# Patient Record
Sex: Male | Born: 1975
Health system: Southern US, Community
[De-identification: ages and names within clinical notes are randomized; demographics above are authoritative.]

## PROBLEM LIST (undated history)

## (undated) DIAGNOSIS — I509 Heart failure, unspecified: Secondary | ICD-10-CM

## (undated) DIAGNOSIS — I10 Essential (primary) hypertension: Secondary | ICD-10-CM

## (undated) DIAGNOSIS — F988 Other specified behavioral and emotional disorders with onset usually occurring in childhood and adolescence: Secondary | ICD-10-CM

---

## 2016-08-01 DIAGNOSIS — F900 Attention-deficit hyperactivity disorder, predominantly inattentive type: Secondary | ICD-10-CM | POA: Diagnosis not present

## 2017-01-23 DIAGNOSIS — F900 Attention-deficit hyperactivity disorder, predominantly inattentive type: Secondary | ICD-10-CM | POA: Diagnosis not present

## 2017-07-17 DIAGNOSIS — F900 Attention-deficit hyperactivity disorder, predominantly inattentive type: Secondary | ICD-10-CM | POA: Diagnosis not present

## 2018-01-15 DIAGNOSIS — F900 Attention-deficit hyperactivity disorder, predominantly inattentive type: Secondary | ICD-10-CM | POA: Diagnosis not present

## 2018-04-11 DIAGNOSIS — Z125 Encounter for screening for malignant neoplasm of prostate: Secondary | ICD-10-CM | POA: Diagnosis not present

## 2018-04-11 DIAGNOSIS — I10 Essential (primary) hypertension: Secondary | ICD-10-CM | POA: Diagnosis not present

## 2018-04-11 DIAGNOSIS — Z136 Encounter for screening for cardiovascular disorders: Secondary | ICD-10-CM | POA: Diagnosis not present

## 2018-04-11 DIAGNOSIS — Z0000 Encounter for general adult medical examination without abnormal findings: Secondary | ICD-10-CM | POA: Diagnosis not present

## 2018-07-09 DIAGNOSIS — F900 Attention-deficit hyperactivity disorder, predominantly inattentive type: Secondary | ICD-10-CM | POA: Diagnosis not present

## 2018-10-10 DIAGNOSIS — I10 Essential (primary) hypertension: Secondary | ICD-10-CM | POA: Diagnosis not present

## 2018-12-03 DIAGNOSIS — E876 Hypokalemia: Secondary | ICD-10-CM | POA: Diagnosis not present

## 2018-12-31 DIAGNOSIS — F900 Attention-deficit hyperactivity disorder, predominantly inattentive type: Secondary | ICD-10-CM | POA: Diagnosis not present

## 2019-01-24 DIAGNOSIS — R55 Syncope and collapse: Secondary | ICD-10-CM | POA: Diagnosis not present

## 2019-01-24 DIAGNOSIS — I4510 Unspecified right bundle-branch block: Secondary | ICD-10-CM | POA: Diagnosis not present

## 2019-01-24 DIAGNOSIS — F10939 Alcohol use, unspecified with withdrawal, unspecified: Secondary | ICD-10-CM | POA: Diagnosis not present

## 2019-01-24 DIAGNOSIS — R400 Somnolence: Secondary | ICD-10-CM | POA: Diagnosis not present

## 2019-01-24 DIAGNOSIS — R4182 Altered mental status, unspecified: Secondary | ICD-10-CM | POA: Diagnosis not present

## 2019-01-24 DIAGNOSIS — E876 Hypokalemia: Secondary | ICD-10-CM | POA: Diagnosis not present

## 2019-01-24 DIAGNOSIS — R000 Tachycardia, unspecified: Secondary | ICD-10-CM | POA: Diagnosis not present

## 2019-01-24 DIAGNOSIS — R251 Tremor, unspecified: Secondary | ICD-10-CM | POA: Diagnosis not present

## 2019-01-24 DIAGNOSIS — F17200 Nicotine dependence, unspecified, uncomplicated: Secondary | ICD-10-CM | POA: Diagnosis not present

## 2019-01-24 DIAGNOSIS — F10230 Alcohol dependence with withdrawal, uncomplicated: Secondary | ICD-10-CM | POA: Diagnosis not present

## 2019-01-24 DIAGNOSIS — Z87891 Personal history of nicotine dependence: Secondary | ICD-10-CM | POA: Diagnosis not present

## 2019-06-24 DIAGNOSIS — F900 Attention-deficit hyperactivity disorder, predominantly inattentive type: Secondary | ICD-10-CM | POA: Diagnosis not present

## 2019-06-24 DIAGNOSIS — F4322 Adjustment disorder with anxiety: Secondary | ICD-10-CM | POA: Diagnosis not present

## 2019-07-18 DIAGNOSIS — R55 Syncope and collapse: Secondary | ICD-10-CM | POA: Diagnosis not present

## 2019-10-14 DIAGNOSIS — I10 Essential (primary) hypertension: Secondary | ICD-10-CM | POA: Diagnosis not present

## 2019-12-16 DIAGNOSIS — F4322 Adjustment disorder with anxiety: Secondary | ICD-10-CM | POA: Diagnosis not present

## 2019-12-16 DIAGNOSIS — F900 Attention-deficit hyperactivity disorder, predominantly inattentive type: Secondary | ICD-10-CM | POA: Diagnosis not present

## 2020-01-26 DIAGNOSIS — R040 Epistaxis: Secondary | ICD-10-CM | POA: Diagnosis not present

## 2020-03-02 DIAGNOSIS — R040 Epistaxis: Secondary | ICD-10-CM | POA: Diagnosis not present

## 2020-03-30 DIAGNOSIS — R040 Epistaxis: Secondary | ICD-10-CM | POA: Diagnosis not present

## 2020-04-19 DIAGNOSIS — T300 Burn of unspecified body region, unspecified degree: Secondary | ICD-10-CM | POA: Diagnosis not present

## 2020-04-19 DIAGNOSIS — Z13220 Encounter for screening for lipoid disorders: Secondary | ICD-10-CM | POA: Diagnosis not present

## 2020-04-19 DIAGNOSIS — Z0000 Encounter for general adult medical examination without abnormal findings: Secondary | ICD-10-CM | POA: Diagnosis not present

## 2020-04-19 DIAGNOSIS — I10 Essential (primary) hypertension: Secondary | ICD-10-CM | POA: Diagnosis not present

## 2020-04-27 DIAGNOSIS — R945 Abnormal results of liver function studies: Secondary | ICD-10-CM | POA: Diagnosis not present

## 2020-04-27 DIAGNOSIS — R7989 Other specified abnormal findings of blood chemistry: Secondary | ICD-10-CM | POA: Diagnosis not present

## 2020-05-11 DIAGNOSIS — R040 Epistaxis: Secondary | ICD-10-CM | POA: Diagnosis not present

## 2020-05-12 DIAGNOSIS — R162 Hepatomegaly with splenomegaly, not elsewhere classified: Secondary | ICD-10-CM | POA: Diagnosis not present

## 2020-05-12 DIAGNOSIS — K760 Fatty (change of) liver, not elsewhere classified: Secondary | ICD-10-CM | POA: Diagnosis not present

## 2020-05-18 ENCOUNTER — Other Ambulatory Visit: Payer: Self-pay | Admitting: Family Medicine

## 2020-05-18 ENCOUNTER — Other Ambulatory Visit: Payer: Self-pay | Admitting: Internal Medicine

## 2020-05-18 DIAGNOSIS — R160 Hepatomegaly, not elsewhere classified: Secondary | ICD-10-CM

## 2020-05-19 DIAGNOSIS — R600 Localized edema: Secondary | ICD-10-CM | POA: Diagnosis not present

## 2020-05-19 DIAGNOSIS — D649 Anemia, unspecified: Secondary | ICD-10-CM | POA: Diagnosis not present

## 2020-05-19 DIAGNOSIS — R17 Unspecified jaundice: Secondary | ICD-10-CM | POA: Diagnosis not present

## 2020-05-19 DIAGNOSIS — R011 Cardiac murmur, unspecified: Secondary | ICD-10-CM | POA: Diagnosis not present

## 2020-05-20 ENCOUNTER — Other Ambulatory Visit: Payer: Self-pay | Admitting: Family Medicine

## 2020-05-20 DIAGNOSIS — R7989 Other specified abnormal findings of blood chemistry: Secondary | ICD-10-CM

## 2020-05-20 DIAGNOSIS — M7989 Other specified soft tissue disorders: Secondary | ICD-10-CM

## 2020-05-21 ENCOUNTER — Other Ambulatory Visit: Payer: Self-pay

## 2020-05-21 ENCOUNTER — Ambulatory Visit (INDEPENDENT_AMBULATORY_CARE_PROVIDER_SITE_OTHER): Payer: BC Managed Care – PPO

## 2020-05-21 DIAGNOSIS — M7989 Other specified soft tissue disorders: Secondary | ICD-10-CM

## 2020-05-21 DIAGNOSIS — R600 Localized edema: Secondary | ICD-10-CM | POA: Diagnosis not present

## 2020-05-21 DIAGNOSIS — R7989 Other specified abnormal findings of blood chemistry: Secondary | ICD-10-CM | POA: Diagnosis not present

## 2020-05-23 ENCOUNTER — Emergency Department (HOSPITAL_COMMUNITY): Payer: BC Managed Care – PPO

## 2020-05-23 ENCOUNTER — Observation Stay (HOSPITAL_COMMUNITY): Payer: BC Managed Care – PPO

## 2020-05-23 ENCOUNTER — Encounter (HOSPITAL_COMMUNITY): Payer: Self-pay | Admitting: Emergency Medicine

## 2020-05-23 ENCOUNTER — Inpatient Hospital Stay (HOSPITAL_COMMUNITY)
Admission: EM | Admit: 2020-05-23 | Discharge: 2020-05-29 | DRG: 871 | Disposition: A | Discharge status: Home / Self Care | Payer: BC Managed Care – PPO | Attending: Internal Medicine | Admitting: Internal Medicine

## 2020-05-23 DIAGNOSIS — E8770 Fluid overload, unspecified: Secondary | ICD-10-CM | POA: Diagnosis present

## 2020-05-23 DIAGNOSIS — J811 Chronic pulmonary edema: Secondary | ICD-10-CM | POA: Diagnosis not present

## 2020-05-23 DIAGNOSIS — J189 Pneumonia, unspecified organism: Secondary | ICD-10-CM | POA: Diagnosis not present

## 2020-05-23 DIAGNOSIS — K709 Alcoholic liver disease, unspecified: Secondary | ICD-10-CM | POA: Diagnosis present

## 2020-05-23 DIAGNOSIS — R778 Other specified abnormalities of plasma proteins: Secondary | ICD-10-CM | POA: Diagnosis present

## 2020-05-23 DIAGNOSIS — K7460 Unspecified cirrhosis of liver: Secondary | ICD-10-CM

## 2020-05-23 DIAGNOSIS — K8020 Calculus of gallbladder without cholecystitis without obstruction: Secondary | ICD-10-CM | POA: Diagnosis not present

## 2020-05-23 DIAGNOSIS — E871 Hypo-osmolality and hyponatremia: Secondary | ICD-10-CM | POA: Diagnosis not present

## 2020-05-23 DIAGNOSIS — F10230 Alcohol dependence with withdrawal, uncomplicated: Secondary | ICD-10-CM | POA: Diagnosis not present

## 2020-05-23 DIAGNOSIS — Z79899 Other long term (current) drug therapy: Secondary | ICD-10-CM

## 2020-05-23 DIAGNOSIS — Z801 Family history of malignant neoplasm of trachea, bronchus and lung: Secondary | ICD-10-CM

## 2020-05-23 DIAGNOSIS — A419 Sepsis, unspecified organism: Principal | ICD-10-CM | POA: Diagnosis present

## 2020-05-23 DIAGNOSIS — D509 Iron deficiency anemia, unspecified: Secondary | ICD-10-CM | POA: Diagnosis not present

## 2020-05-23 DIAGNOSIS — D500 Iron deficiency anemia secondary to blood loss (chronic): Secondary | ICD-10-CM | POA: Diagnosis present

## 2020-05-23 DIAGNOSIS — R7989 Other specified abnormal findings of blood chemistry: Secondary | ICD-10-CM | POA: Diagnosis not present

## 2020-05-23 DIAGNOSIS — I509 Heart failure, unspecified: Secondary | ICD-10-CM

## 2020-05-23 DIAGNOSIS — D122 Benign neoplasm of ascending colon: Secondary | ICD-10-CM | POA: Diagnosis not present

## 2020-05-23 DIAGNOSIS — K922 Gastrointestinal hemorrhage, unspecified: Secondary | ICD-10-CM | POA: Diagnosis not present

## 2020-05-23 DIAGNOSIS — I517 Cardiomegaly: Secondary | ICD-10-CM | POA: Diagnosis not present

## 2020-05-23 DIAGNOSIS — R040 Epistaxis: Secondary | ICD-10-CM | POA: Diagnosis present

## 2020-05-23 DIAGNOSIS — K635 Polyp of colon: Secondary | ICD-10-CM | POA: Diagnosis present

## 2020-05-23 DIAGNOSIS — R0609 Other forms of dyspnea: Secondary | ICD-10-CM | POA: Diagnosis present

## 2020-05-23 DIAGNOSIS — R188 Other ascites: Secondary | ICD-10-CM | POA: Diagnosis not present

## 2020-05-23 DIAGNOSIS — K760 Fatty (change of) liver, not elsewhere classified: Secondary | ICD-10-CM | POA: Diagnosis not present

## 2020-05-23 DIAGNOSIS — K921 Melena: Secondary | ICD-10-CM | POA: Diagnosis not present

## 2020-05-23 DIAGNOSIS — E872 Acidosis, unspecified: Secondary | ICD-10-CM | POA: Diagnosis present

## 2020-05-23 DIAGNOSIS — E876 Hypokalemia: Secondary | ICD-10-CM | POA: Diagnosis not present

## 2020-05-23 DIAGNOSIS — K625 Hemorrhage of anus and rectum: Secondary | ICD-10-CM

## 2020-05-23 DIAGNOSIS — R000 Tachycardia, unspecified: Secondary | ICD-10-CM | POA: Diagnosis present

## 2020-05-23 DIAGNOSIS — K5730 Diverticulosis of large intestine without perforation or abscess without bleeding: Secondary | ICD-10-CM | POA: Diagnosis present

## 2020-05-23 DIAGNOSIS — K8080 Other cholelithiasis without obstruction: Secondary | ICD-10-CM | POA: Diagnosis not present

## 2020-05-23 DIAGNOSIS — D649 Anemia, unspecified: Secondary | ICD-10-CM

## 2020-05-23 DIAGNOSIS — R160 Hepatomegaly, not elsewhere classified: Secondary | ICD-10-CM | POA: Diagnosis not present

## 2020-05-23 DIAGNOSIS — R0602 Shortness of breath: Secondary | ICD-10-CM | POA: Diagnosis not present

## 2020-05-23 DIAGNOSIS — J90 Pleural effusion, not elsewhere classified: Secondary | ICD-10-CM | POA: Diagnosis not present

## 2020-05-23 DIAGNOSIS — J9811 Atelectasis: Secondary | ICD-10-CM | POA: Diagnosis not present

## 2020-05-23 DIAGNOSIS — Z20822 Contact with and (suspected) exposure to covid-19: Secondary | ICD-10-CM | POA: Diagnosis present

## 2020-05-23 DIAGNOSIS — F1010 Alcohol abuse, uncomplicated: Secondary | ICD-10-CM | POA: Diagnosis not present

## 2020-05-23 DIAGNOSIS — I10 Essential (primary) hypertension: Secondary | ICD-10-CM | POA: Diagnosis not present

## 2020-05-23 DIAGNOSIS — I110 Hypertensive heart disease with heart failure: Secondary | ICD-10-CM | POA: Diagnosis not present

## 2020-05-23 HISTORY — DX: Heart failure, unspecified: I50.9

## 2020-05-23 HISTORY — DX: Other specified behavioral and emotional disorders with onset usually occurring in childhood and adolescence: F98.8

## 2020-05-23 HISTORY — DX: Essential (primary) hypertension: I10

## 2020-05-23 LAB — BASIC METABOLIC PANEL
Anion gap: 11 (ref 5–15)
Anion gap: 11 (ref 5–15)
BUN: 5 mg/dL — ABNORMAL LOW (ref 6–20)
BUN: <5 mg/dL — ABNORMAL LOW (ref 6–20)
CO2: 24 mmol/L (ref 22–32)
CO2: 24 mmol/L (ref 22–32)
Calcium: 8.2 mg/dL — ABNORMAL LOW (ref 8.9–10.3)
Calcium: 7.7 mg/dL — ABNORMAL LOW (ref 8.9–10.3)
Chloride: 84 mmol/L — ABNORMAL LOW (ref 98–111)
Chloride: 81 mmol/L — ABNORMAL LOW (ref 98–111)
Creatinine, Ser: 0.7 mg/dL (ref 0.61–1.24)
Creatinine, Ser: 0.66 mg/dL (ref 0.61–1.24)
GFR, Estimated: >60 mL/min (ref >60)
GFR, Estimated: >60 mL/min (ref >60)
Glucose, Bld: 114 mg/dL — ABNORMAL HIGH (ref 70–99)
Glucose, Bld: 114 mg/dL — ABNORMAL HIGH (ref 70–99)
Potassium: 3.7 mmol/L (ref 3.5–5.1)
Potassium: 3.3 mmol/L — ABNORMAL LOW (ref 3.5–5.1)
Sodium: 119 mmol/L — CL (ref 135–145)
Sodium: 116 mmol/L — CL (ref 135–145)

## 2020-05-23 LAB — IRON AND TIBC
Iron: 23 ug/dL — ABNORMAL LOW (ref 45–182)
Saturation Ratios: 10 % — ABNORMAL LOW (ref 17.9–39.5)
TIBC: 237 ug/dL — ABNORMAL LOW (ref 250–450)
UIBC: 214 ug/dL

## 2020-05-23 LAB — LACTIC ACID, PLASMA: Lactic Acid, Venous: 2.5 mmol/L (ref 0.5–1.9)

## 2020-05-23 LAB — HEPATIC FUNCTION PANEL
ALT: 17 U/L (ref 0–44)
AST: 132 U/L — ABNORMAL HIGH (ref 15–41)
Albumin: 2.2 g/dL — ABNORMAL LOW (ref 3.5–5.0)
Alkaline Phosphatase: 156 U/L — ABNORMAL HIGH (ref 38–126)
Bilirubin, Direct: 3.5 mg/dL — ABNORMAL HIGH (ref 0.0–0.2)
Indirect Bilirubin: 3.2 mg/dL — ABNORMAL HIGH (ref 0.3–0.9)
Total Bilirubin: 6.7 mg/dL — ABNORMAL HIGH (ref 0.3–1.2)
Total Protein: 7.2 g/dL (ref 6.5–8.1)

## 2020-05-23 LAB — CBC
HCT: 19.2 % — ABNORMAL LOW (ref 39.0–52.0)
Hemoglobin: 5.9 g/dL — CL (ref 13.0–17.0)
MCH: 26.9 pg (ref 26.0–34.0)
MCHC: 30.7 g/dL (ref 30.0–36.0)
MCV: 87.7 fL (ref 80.0–100.0)
Platelets: 253 10*3/uL (ref 150–400)
RBC: 2.19 MIL/uL — ABNORMAL LOW (ref 4.22–5.81)
RDW: 20.7 % — ABNORMAL HIGH (ref 11.5–15.5)
WBC: 19.6 10*3/uL — ABNORMAL HIGH (ref 4.0–10.5)
nRBC: 0.2 % (ref 0.0–0.2)

## 2020-05-23 LAB — TROPONIN I (HIGH SENSITIVITY): Troponin I (High Sensitivity): 19 ng/L — ABNORMAL HIGH (ref <18)

## 2020-05-23 LAB — FOLATE: Folate: 2.2 ng/mL — ABNORMAL LOW (ref >5.9)

## 2020-05-23 LAB — PROTIME-INR
INR: 1.5 — ABNORMAL HIGH (ref 0.8–1.2)
Prothrombin Time: 17.4 seconds — ABNORMAL HIGH (ref 11.4–15.2)

## 2020-05-23 LAB — MAGNESIUM: Magnesium: 1.9 mg/dL (ref 1.7–2.4)

## 2020-05-23 LAB — APTT: aPTT: 42 seconds — ABNORMAL HIGH (ref 24–36)

## 2020-05-23 LAB — PREPARE RBC (CROSSMATCH)

## 2020-05-23 LAB — RESP PANEL BY RT-PCR (FLU A&B, COVID) ARPGX2
Influenza A by PCR: NEGATIVE
Influenza B by PCR: NEGATIVE
SARS Coronavirus 2 by RT PCR: NEGATIVE

## 2020-05-23 LAB — D-DIMER, QUANTITATIVE: D-Dimer, Quant: 5.47 ug/mL-FEU — ABNORMAL HIGH (ref 0.00–0.50)

## 2020-05-23 LAB — TSH: TSH: 3.911 u[IU]/mL (ref 0.350–4.500)

## 2020-05-23 LAB — C-REACTIVE PROTEIN: CRP: 13.1 mg/dL — ABNORMAL HIGH (ref <1.0)

## 2020-05-23 LAB — VITAMIN B12: Vitamin B-12: 1427 pg/mL — ABNORMAL HIGH (ref 180–914)

## 2020-05-23 LAB — AMMONIA: Ammonia: 51 umol/L — ABNORMAL HIGH (ref 9–35)

## 2020-05-23 LAB — PROCALCITONIN: Procalcitonin: 0.58 ng/mL

## 2020-05-23 LAB — POC OCCULT BLOOD, ED: Fecal Occult Bld: POSITIVE — AB

## 2020-05-23 LAB — BRAIN NATRIURETIC PEPTIDE: B Natriuretic Peptide: 329.2 pg/mL — ABNORMAL HIGH (ref 0.0–100.0)

## 2020-05-23 LAB — ABO/RH: ABO/RH(D): A POS

## 2020-05-23 MED ORDER — FUROSEMIDE 10 MG/ML IJ SOLN
40.0000 mg | Freq: Once | INTRAMUSCULAR | Status: AC
  Administered 2020-05-23: 40 mg via INTRAVENOUS
  Filled 2020-05-23: qty 4

## 2020-05-23 MED ORDER — THIAMINE HCL 100 MG/ML IJ SOLN
100.0000 mg | Freq: Every day | INTRAMUSCULAR | Status: DC
  Administered 2020-05-23 – 2020-05-26 (×2): 100 mg via INTRAVENOUS
  Filled 2020-05-23 (×2): qty 2

## 2020-05-23 MED ORDER — CEFTRIAXONE SODIUM 2 G IJ SOLR
2.0000 g | INTRAMUSCULAR | Status: AC
  Administered 2020-05-24 – 2020-05-28 (×5): 2 g via INTRAVENOUS
  Filled 2020-05-23 (×2): qty 2
  Filled 2020-05-23: qty 20
  Filled 2020-05-23 (×2): qty 2

## 2020-05-23 MED ORDER — ADULT MULTIVITAMIN W/MINERALS CH
1.0000 | ORAL_TABLET | Freq: Every day | ORAL | Status: DC
  Administered 2020-05-23 – 2020-05-28 (×6): 1 via ORAL
  Filled 2020-05-23 (×6): qty 1

## 2020-05-23 MED ORDER — SODIUM CHLORIDE 0.9 % IV SOLN
1.0000 g | Freq: Once | INTRAVENOUS | Status: AC
  Administered 2020-05-23: 1 g via INTRAVENOUS
  Filled 2020-05-23: qty 10

## 2020-05-23 MED ORDER — THIAMINE HCL 100 MG PO TABS
100.0000 mg | ORAL_TABLET | Freq: Every day | ORAL | Status: DC
  Administered 2020-05-23 – 2020-05-28 (×5): 100 mg via ORAL
  Filled 2020-05-23 (×5): qty 1

## 2020-05-23 MED ORDER — LORAZEPAM 1 MG PO TABS
1.0000 mg | ORAL_TABLET | ORAL | Status: AC | PRN
  Administered 2020-05-23 – 2020-05-26 (×2): 1 mg via ORAL
  Filled 2020-05-23 (×2): qty 1

## 2020-05-23 MED ORDER — LORAZEPAM 2 MG/ML IJ SOLN
1.0000 mg | INTRAMUSCULAR | Status: AC | PRN
  Administered 2020-05-24: 1 mg via INTRAVENOUS
  Administered 2020-05-24: 2 mg via INTRAVENOUS
  Administered 2020-05-25: 1 mg via INTRAVENOUS
  Filled 2020-05-23 (×3): qty 1

## 2020-05-23 MED ORDER — AZITHROMYCIN 500 MG IV SOLR
500.0000 mg | INTRAVENOUS | Status: AC
  Administered 2020-05-24 – 2020-05-28 (×5): 500 mg via INTRAVENOUS
  Filled 2020-05-23 (×6): qty 500

## 2020-05-23 MED ORDER — PANTOPRAZOLE SODIUM 40 MG IV SOLR
40.0000 mg | Freq: Two times a day (BID) | INTRAVENOUS | Status: DC
  Administered 2020-05-23: 40 mg via INTRAVENOUS
  Filled 2020-05-23: qty 40

## 2020-05-23 MED ORDER — DEXTROAMPHETAMINE SULFATE 5 MG PO TABS
20.0000 mg | ORAL_TABLET | Freq: Two times a day (BID) | ORAL | Status: DC
  Administered 2020-05-24 – 2020-05-28 (×10): 20 mg via ORAL
  Filled 2020-05-23 (×11): qty 4

## 2020-05-23 MED ORDER — LORAZEPAM 1 MG PO TABS
0.0000 mg | ORAL_TABLET | Freq: Four times a day (QID) | ORAL | Status: AC
  Administered 2020-05-24 (×2): 1 mg via ORAL
  Filled 2020-05-23 (×3): qty 1

## 2020-05-23 MED ORDER — ACETAMINOPHEN 325 MG PO TABS
325.0000 mg | ORAL_TABLET | Freq: Four times a day (QID) | ORAL | Status: DC | PRN
Start: 2020-05-23 — End: 2020-05-29

## 2020-05-23 MED ORDER — IOHEXOL 350 MG/ML SOLN
75.0000 mL | Freq: Once | INTRAVENOUS | Status: AC | PRN
  Administered 2020-05-23: 75 mL via INTRAVENOUS

## 2020-05-23 MED ORDER — SODIUM CHLORIDE 0.9 % IV SOLN
INTRAVENOUS | Status: DC
Start: 2020-05-24 — End: 2020-05-23

## 2020-05-23 MED ORDER — ACETAMINOPHEN 650 MG RE SUPP: 325.0000 mg | Freq: Four times a day (QID) | RECTAL | Status: DC | PRN

## 2020-05-23 MED ORDER — AMLODIPINE BESYLATE 10 MG PO TABS
10.0000 mg | ORAL_TABLET | Freq: Every day | ORAL | Status: DC
  Administered 2020-05-24 – 2020-05-25 (×2): 10 mg via ORAL
  Filled 2020-05-23 (×2): qty 1

## 2020-05-23 MED ORDER — SODIUM CHLORIDE 0.9 % IV SOLN
80.0000 mg | Freq: Once | INTRAVENOUS | Status: AC
  Administered 2020-05-24: 80 mg via INTRAVENOUS
  Filled 2020-05-23: qty 80

## 2020-05-23 MED ORDER — FOLIC ACID 1 MG PO TABS
1.0000 mg | ORAL_TABLET | Freq: Every day | ORAL | Status: DC
  Administered 2020-05-23 – 2020-05-28 (×6): 1 mg via ORAL
  Filled 2020-05-23 (×6): qty 1

## 2020-05-23 MED ORDER — ONDANSETRON HCL 4 MG/2ML IJ SOLN: 4.0000 mg | Freq: Four times a day (QID) | INTRAMUSCULAR | Status: DC | PRN

## 2020-05-23 MED ORDER — LORAZEPAM 1 MG PO TABS: 0.0000 mg | ORAL_TABLET | Freq: Two times a day (BID) | ORAL | Status: AC

## 2020-05-23 MED ORDER — POTASSIUM CHLORIDE CRYS ER 20 MEQ PO TBCR
40.0000 meq | EXTENDED_RELEASE_TABLET | Freq: Once | ORAL | Status: AC
  Administered 2020-05-24: 40 meq via ORAL
  Filled 2020-05-23: qty 2

## 2020-05-23 MED ORDER — FUROSEMIDE 10 MG/ML IJ SOLN
40.0000 mg | Freq: Two times a day (BID) | INTRAMUSCULAR | Status: DC
  Administered 2020-05-24 – 2020-05-25 (×3): 40 mg via INTRAVENOUS
  Filled 2020-05-23 (×3): qty 4

## 2020-05-23 MED ORDER — ONDANSETRON HCL 4 MG PO TABS: 4.0000 mg | ORAL_TABLET | Freq: Four times a day (QID) | ORAL | Status: DC | PRN

## 2020-05-23 MED ORDER — SODIUM CHLORIDE 0.9 % IV SOLN
500.0000 mg | Freq: Once | INTRAVENOUS | Status: AC
  Administered 2020-05-23: 500 mg via INTRAVENOUS
  Filled 2020-05-23 (×2): qty 500

## 2020-05-23 MED ORDER — SODIUM CHLORIDE 0.9 % IV SOLN
10.0000 mL/h | Freq: Once | INTRAVENOUS | Status: AC
  Administered 2020-05-24: 10 mL/h via INTRAVENOUS

## 2020-05-23 MED ORDER — PANTOPRAZOLE SODIUM 40 MG IV SOLR
40.0000 mg | Freq: Two times a day (BID) | INTRAVENOUS | Status: DC
  Administered 2020-05-24 – 2020-05-28 (×10): 40 mg via INTRAVENOUS
  Filled 2020-05-23 (×10): qty 40

## 2020-05-23 NOTE — ED Notes (Addendum)
Pt stated he is starting to feel anxious. RN did a CIWA. Pt got a score of 6. Pt states he drinks everyday. His last drink was yesterday 05/22/20. Pt stated he drank Bourbon (alcoholic beverage) and that he drinks too much. Pt states he can not say how much he drinks per day and that he knows its too much.

## 2020-05-23 NOTE — ED Provider Notes (Addendum)
[facility] EMERGENCY DEPARTMENT Provider Note   CSN: [id number] Arrival date & time: 05/23/20  1447     History Chief Complaint  Patient presents with  . Shortness of Breath    Edward Valdez is a 45 y.o. male. Patient in room and available to be seen at 3:14 PM.  I saw him at this time. HPI He presents for evaluation of shortness of breath, dyspnea on exertion and leg swelling, right greater than left.  Onset of symptoms 7 days ago.  He denies chest pain, back pain, abdominal pain, nausea or vomiting.  He complains of pain in the right lower leg.  He is currently taking a diuretic. He denies fever, chills, cough. nausea or vomiting.  He saw his PCP, several days ago and was started on spironolactone for diuresis with suspected component of "liver."  Patient apparently has concerning history for alcohol abuse.  His PCP informed him that he may have congestive heart failure, because his BNP was elevated.  He has not had an echocardiogram..  He had bilateral lower leg Dopplers done on 05/21/2020 which were negative for clot.  His current medicines include: Atenolol-chlorthalidone 100-25 mg 1/2 tablet each day, amlodipine 10 mg once daily, potassium 1 daily, magnesium 500 mg 1 daily, spironolactone 100 mg 1/day.  CBC done on 04/29/2020, hemoglobin 8.3, MCV 81.6.  Serum iron done on 05/20/2020 was 30, low.  PT/INR done on 04/29/2020 was normal.  Complete metabolic panel done on 04/19/2020 remarkable for elevated total bilirubin, increased alkaline phosphatase, increased AST.  No other known modifying factors.    Past Medical History:  Diagnosis Date  . ADD (attention deficit disorder)   . CHF (congestive heart failure) (HCC)   . Hypertension     Patient Active Problem List   Diagnosis Date Noted  . Essential hypertension 05/23/2020  . Hyperbilirubinemia 05/23/2020    History reviewed. No pertinent surgical history.     No family history on file.  Social History    Tobacco Use  . Smoking status: Never Smoker  . Smokeless tobacco: Never Used  Substance Use Topics  . Alcohol use: Not Currently  . Drug use: Yes    Types: Marijuana    Home Medications Prior to Admission medications   Not on File    Allergies    Patient has no allergy information on record.  Review of Systems   Review of Systems  All other systems reviewed and are negative.   Physical Exam Updated Vital Signs BP (!) 116/58   Pulse (!) 118   Temp 99.2 F (37.3 C)   Resp (!) 21   SpO2 92%   Physical Exam Vitals and nursing note reviewed.  Constitutional:      General: He is not in acute distress.    Appearance: He is well-developed. He is not ill-appearing, toxic-appearing or diaphoretic.  HENT:     Head: Normocephalic and atraumatic.     Right Ear: External ear normal.     Left Ear: External ear normal.  Eyes:     Conjunctiva/sclera: Conjunctivae normal.     Pupils: Pupils are equal, round, and reactive to light.  Neck:     Trachea: Phonation normal.  Cardiovascular:     Rate and Rhythm: Normal rate and regular rhythm.     Heart sounds: Normal heart sounds.  Pulmonary:     Effort: Pulmonary effort is normal. No respiratory distress.     Breath sounds: Normal breath sounds. No stridor. No wheezing,  rhonchi or rales.  Abdominal:     General: There is distension.     Palpations: Abdomen is soft.     Tenderness: There is no abdominal tenderness.  Genitourinary:    Comments: Normal anus.  Small amount of dark stool in rectal vault.  Stool sent for Hemoccult testing Musculoskeletal:        General: Normal range of motion.     Cervical back: Normal range of motion and neck supple.     Right lower leg: Edema present.     Left lower leg: Edema present.     Comments: Right lower leg larger than left lower leg, both moderately swollen.  Skin:    General: Skin is warm and dry.  Neurological:     Mental Status: He is alert and oriented to person, place, and  time.     Cranial Nerves: No cranial nerve deficit.     Sensory: No sensory deficit.     Motor: No abnormal muscle tone.     Coordination: Coordination normal.  Psychiatric:        Mood and Affect: Mood normal.        Behavior: Behavior normal.        Thought Content: Thought content normal.        Judgment: Judgment normal.     ED Results / Procedures / Treatments   Labs (all labs ordered are listed, but only abnormal results are displayed) Labs Reviewed  BASIC METABOLIC PANEL - Abnormal; Notable for the following components:      Result Value   Sodium 119 (*)    Chloride 84 (*)    Glucose, Bld 114 (*)    BUN 5 (*)    Calcium 8.2 (*)    All other components within normal limits  CBC - Abnormal; Notable for the following components:   WBC 19.6 (*)    RBC 2.19 (*)    Hemoglobin 5.9 (*)    HCT 19.2 (*)    RDW 20.7 (*)    All other components within normal limits  BRAIN NATRIURETIC PEPTIDE - Abnormal; Notable for the following components:   B Natriuretic Peptide 329.2 (*)    All other components within normal limits  D-DIMER, QUANTITATIVE - Abnormal; Notable for the following components:   D-Dimer, Quant 5.47 (*)    All other components within normal limits  POC OCCULT BLOOD, ED - Abnormal; Notable for the following components:   Fecal Occult Bld POSITIVE (*)    All other components within normal limits  RESP PANEL BY RT-PCR (FLU A&B, COVID) ARPGX2  TYPE AND SCREEN  PREPARE RBC (CROSSMATCH)  ABO/RH    EKG EKG Interpretation  Date/Time:  Sunday May 23 2020 14:55:48 EDT Ventricular Rate:  103 PR Interval:  174 QRS Duration: 134 QT Interval:  374 QTC Calculation: 489 R Axis:   88 Text Interpretation: Sinus tachycardia Right bundle branch block Septal infarct , age undetermined Abnormal ECG No old tracing to compare Confirmed by Mancel Bale [id number]) on 05/23/2020 3:03:56 PM   Radiology DG Chest 2 View  Result Date: 05/23/2020 CLINICAL DATA:  Shortness of  breath EXAM: CHEST - 2 VIEW COMPARISON:  None. FINDINGS: Cardiomegaly. There is a rounded, masslike opacity of the central right upper lobe which measures approximately 4.2 cm in projection. There is superimposed heterogeneous and interstitial airspace opacity, particularly of the perihilar left lung. Small left, trace right pleural effusions. The visualized skeletal structures are unremarkable. IMPRESSION: 1. There is a rounded, masslike  opacity of the central right upper lobe which measures approximately 4.2 cm in projection. This is concerning for malignancy, although may reflect infectious airspace disease. Consider CT to further evaluate. Absolute minimum recommend radiographic follow-up at 6-8 weeks to ensure complete resolution. 2. There is superimposed heterogeneous and interstitial airspace opacity, particularly of the perihilar left lung. Small left, trace right pleural effusions. Findings suggest edema or infection. 3. Cardiomegaly. Electronically Signed   By: Lauralyn Primes M.D.   On: 05/23/2020 15:19   CT Angio Chest PE W/Cm &/Or Wo Cm  Result Date: 05/23/2020 CLINICAL DATA:  PE suspected, low/intermediate prob, positive D-dimer Shortness of breath, possible PE, lung mass EXAM: CT ANGIOGRAPHY CHEST WITH CONTRAST TECHNIQUE: Multidetector CT imaging of the chest was performed using the standard protocol during bolus administration of intravenous contrast. Multiplanar CT image reconstructions and MIPs were obtained to evaluate the vascular anatomy. CONTRAST:  42mL OMNIPAQUE IOHEXOL 350 MG/ML SOLN COMPARISON:  Radiograph earlier today. FINDINGS: Cardiovascular: Evaluation for pulmonary embolus is diagnostic to the lobar levels in the mid and upper lung zones and segmental levels at the bases. No evidence of pulmonary embolus. Normal caliber thoracic aorta without acute aortic abnormality. Upper normal heart size. No pericardial effusion. Suggestion of scattered coronary artery calcifications, not well  assessed due to motion on the current exam. Mediastinum/Nodes: Small mediastinal lymph nodes not enlarged by size criteria. No enlarged hilar lymph nodes. No axillary adenopathy. Patulous esophagus. No esophageal wall thickening. No suspicious thyroid nodule. Lungs/Pleura: There is no pulmonary mass. The masslike opacity on radiograph corresponds to a rounded area of ground-glass and consolidative opacity in the right upper lobe. Similar ground-glass and consolidative opacities within the apical and posterior segments of the right upper lobe, anterior left upper lobe, lingula, and to a lesser extent right middle and lower lobe. Some of these ground-glass opacities appear slightly nodular. Small to moderate left pleural effusion with adjacent compressive atelectasis. Trace right pleural effusion with adjacent atelectasis. Trace fluid in the fissures. There is minimal basilar septal thickening. Upper Abdomen: Diffuse hepatic steatosis. Included liver appears enlarged. No acute upper abdominal findings. Musculoskeletal: There are no acute or suspicious osseous abnormalities. Review of the MIP images confirms the above findings. IMPRESSION: 1. No central pulmonary embolus. 2. Multifocal ground-glass and consolidative opacities throughout both lungs. Differential considerations include pulmonary edema or atypical pneumonia. 3. No pulmonary mass. Radiographic appearance in the right lung correlates to a rounded area of ground-glass and consolidative opacity in the right upper lobe, as described above. 4. Small to moderate left and trace right pleural effusions with adjacent compressive atelectasis. 5. Suspect coronary artery calcifications. 6. Incidental note of hepatic steatosis and probable hepatomegaly in the upper abdomen. Electronically Signed   By: Narda Rutherford M.D.   On: 05/23/2020 18:43    Procedures .Critical Care Performed by: Mancel Bale, MD Authorized by: Mancel Bale, MD   Critical care  provider statement:    Critical care time (minutes):  75   Critical care start time:  05/23/2020 3:05 PM   Critical care end time:  05/23/2020 8:04 PM   Critical care time was exclusive of:  Separately billable procedures and treating other patients   Critical care was necessary to treat or prevent imminent or life-threatening deterioration of the following conditions:  Circulatory failure   Critical care was time spent personally by me on the following activities:  Blood draw for specimens, development of treatment plan with patient or surrogate, discussions with consultants, evaluation of patient's response  to treatment, examination of patient, obtaining history from patient or surrogate, ordering and performing treatments and interventions, ordering and review of laboratory studies, pulse oximetry, re-evaluation of patient's condition, review of old charts and ordering and review of radiographic studies     Medications Ordered in ED Medications  0.9 %  sodium chloride infusion (has no administration in time range)  furosemide (LASIX) injection 40 mg (has no administration in time range)  cefTRIAXone (ROCEPHIN) 1 g in sodium chloride 0.9 % 100 mL IVPB (has no administration in time range)  azithromycin (ZITHROMAX) 500 mg in sodium chloride 0.9 % 250 mL IVPB (has no administration in time range)  iohexol (OMNIPAQUE) 350 MG/ML injection 75 mL (75 mLs Intravenous Contrast Given 05/23/20 1825)    ED Course  I have reviewed the triage vital signs and the nursing notes.  Pertinent labs & imaging results that were available during my care of the patient were reviewed by me and considered in my medical decision making (see chart for details).  Clinical Course as of 05/23/20 2007  Sun May 23, 2020  1507 Abnormal EKG.  Patient not in room yet, and not currently accessible.  EKG is abnormal and there is no comparison available. [EW]  1657 Patient states he is aware of having anemia but is not taking  iron, until his leg swelling improves. [EW]    Clinical Course User Index [EW] Mancel BaleWentz, Timur Nibert, MD   MDM Rules/Calculators/A&P                           Patient Vitals for the past 24 hrs:  BP Temp Pulse Resp SpO2  05/23/20 2000 (!) 116/58 -- (!) 118 (!) 21 92 %  05/23/20 1945 121/67 -- (!) 101 18 93 %  05/23/20 1930 122/66 -- 100 15 95 %  05/23/20 1915 117/62 -- 100 18 94 %  05/23/20 1900 122/60 -- (!) 102 17 92 %  05/23/20 1845 128/73 -- (!) 103 (!) 21 94 %  05/23/20 1745 127/76 -- (!) 103 20 95 %  05/23/20 1730 124/84 -- (!) 101 (!) 25 97 %  05/23/20 1715 123/71 -- 98 (!) 21 94 %  05/23/20 1700 124/74 -- 98 (!) 25 98 %  05/23/20 1645 116/63 -- 97 (!) 25 96 %  05/23/20 1630 115/65 -- 98 19 97 %  05/23/20 1615 120/73 -- 100 (!) 22 97 %  05/23/20 1600 120/72 -- 95 19 99 %  05/23/20 1545 120/74 -- 99 (!) 22 100 %  05/23/20 1530 122/73 -- 99 (!) 21 98 %  05/23/20 1458 121/69 99.2 F (37.3 C) (!) 102 18 99 %    7:24 PM Reevaluation with update and discussion. After initial assessment and treatment, an updated evaluation reveals your is comfortable with oxygen saturation 95% on room air.  Vital signs are reassuring with mild tachypnea present.  Patient states he has not seen a rectal bleeding.  Findings discussed with patient and wife, all questions answered. Mancel BaleElliott Ganesh Deeg   Medical Decision Making:  This patient is presenting for evaluation of shortness of breath and dyspnea on exertion, which does require a range of treatment options, and is a complaint that involves a high risk of morbidity and mortality. The differential diagnoses include ACS, PE, pneumonia, heart failure. I decided to review old records, and in summary middle-aged male presenting with ongoing symptoms, worsening despite evaluation treatment by his PCP..  I did not require additional historical information  from anyone.  Clinical Laboratory Tests Ordered, included CBC, Metabolic panel and D-dimer, BNP. Review  indicates D-dimer high, BNP high, white count high, hemoglobin low, sodium low, chloride low, glucose, BUN low, calcium low. Radiologic Tests Ordered, included chest x-ray, follow-up CT imaging.  I independently Visualized: Radiograph images, which show consolidation right lung, with generalized groundglass opacity somewhat irregular pattern with differential diagnosis including pulmonary edema, and atypical pneumonia.  Cardiac Monitor Tracing which shows normal sinus rhythm    Critical Interventions-clinical evaluation, after testing, radiologic imaging, Covid testing, chest x-ray and advanced imaging with CT, observation , reassessment.  IV diuretic given.  Blood transfusion ordered for symptomatic anemia  After These Interventions, the Patient was reevaluated and was found with signs and symptoms of pulmonary edema.  Patient presenting with illness for about a week characterized primarily by shortness of breath with exertion.  Patient with significant anemia likely contributing to shortness of breath.  Hemoglobin dropped at least 2 g over 3 weeks, without known blood loss.  Stool guaiac positive, patient has not seen rectal bleeding and is not vomiting blood he does have a history of recurrent nosebleeding in the past year.  He has seen ENT for that and had coagulation with silver nitrate but no advanced procedures for nosebleeding.  This may also be dilutional, related to fluid overload, with chronic iron deficiency.  Patient apparently has alcohol abuse potential and possibly drinks excessive amounts of alcohol.  No confirmed history of congestive heart failure.  Doubt pneumonia without cough or fever.  Patient will be covered with Rocephin and Zithromax for possibility of community-acquired pneumonia.  He will require hospitalization for stabilization further work-up and treatment.  Addendum, 06/17/20-clarifying medical decision making.  Patient does not have convincing signs or symptoms of  sepsis/severe sepsis.  Laboratory evaluation and imaging, does not provide evidence for unifying diagnosis of pneumonia, as a reason for his admission.  Nor does he have findings of infection leading to his other acute problems. The patient was not treated for suspected sepsis because of these findings.  The abnormal vital signs, are suspected to be secondary to multiple comorbidities including fluid overload/congestive heart failure, anemia, malnutrition.  Empiric antibiotics were begun for possibility of respiratory infection including pneumonia, etiology unspecified.  CRITICAL CARE-yes Performed by: Mancel Bale  Nursing Notes Reviewed/ Care Coordinated Applicable Imaging Reviewed Interpretation of Laboratory Data incorporated into ED treatment  8:05 PM-Consult complete with hospitalist. Patient case explained and discussed.  He agrees to admit patient for further evaluation and treatment. Call ended at 8:25 PM  Plan: Admit   Final Clinical Impression(s) / ED Diagnoses Final diagnoses:  Acute on chronic congestive heart failure, unspecified heart failure type (HCC)  Anemia, unspecified type  Hyponatremia  Rectal bleeding    Rx / DC Orders ED Discharge Orders    None       Mancel Bale, MD 05/23/20 2025    Mancel Bale, MD 06/17/20 1254

## 2020-05-23 NOTE — ED Triage Notes (Signed)
Pt reports SOB x 1 week.  Denies any other symptoms.  States his PCP mentioned CHF diagnosis to him and he is taking a diuretic.

## 2020-05-23 NOTE — H&P (Signed)
History and Physical    Edward Valdez NWG:[id number] DOB: 07/24/1975 DOA: 05/23/2020  PCP: [facility]  Patient coming from: Home   Chief Complaint:  Chief Complaint  Patient presents with  . Shortness of Breath     HPI:    45 year old male with past medical history of alcohol abuse, hypertension who presents to [facility] emergency department with dyspnea on exertion.  Patient explains that for the past 2 weeks he has been experiencing shortness of breath.  The shortness of breath was initially mild in intensity but as the days progressed shortness of breath became more and more severe.  Shortness of breath is worse with exertion and improved with rest.  Over the span of time patient is complaining of associated bilateral lower extremity edema, increasing abdominal girth, generalized malaise and weakness as well as episodes of paroxysmal nocturnal dyspnea and progressive yellowing of the skin.  Patient additionally is complaining of episodic chest tightness.  Mild to moderate in intensity, located in the midsternal region without any alleviating or exacerbating factors.  Upon further questioning patient denies fever, sick contacts, recent travel or contact with confirmed COVID-19 infection.  Because of patient's progressively worsening symptoms patient's primary care provider sent the patient to [facility] emergency department for evaluation.  Upon evaluation in the emergency department patient has found to have multiple ongoing medical issues including bilateral infiltrates on chest imaging concerning for a combination of pneumonia and pulmonary edema.  Patient also found to have substantial hyponatremia of 119 and substantial anemia with hemoglobin of 5.9.  2 units of packed red blood cells were ordered for transfusion.  Patient was administered 40 mg of intravenous Lasix.  The hospitalist group was then called to assess the patient for admission  to the hospital.  Review of Systems:   Review of Systems  Constitutional: Positive for malaise/fatigue and weight loss.  Respiratory: Positive for shortness of breath.   Cardiovascular: Positive for chest pain, leg swelling and PND.  Neurological: Positive for weakness.  All other systems reviewed and are negative.   Past Medical History:  Diagnosis Date  . ADD (attention deficit disorder)   . CHF (congestive heart failure) (HCC)   . Hypertension     History reviewed. No pertinent surgical history.   reports that he has never smoked. He has never used smokeless tobacco. He reports current alcohol use of about 56.0 standard drinks of alcohol per week. He reports current drug use. Drug: Marijuana.  No Known Allergies  Family History  Problem Relation Age of Onset  . Lung cancer Mother      Prior to Admission medications   Medication Sig Start Date End Date Taking? Authorizing Provider  amLODipine (NORVASC) 10 MG tablet Take 10 mg by mouth daily. 04/19/20  Yes [provider]  dextroamphetamine (DEXTROSTAT) 10 MG tablet Take 20 mg by mouth 2 (two) times daily. 04/16/20  Yes [provider]  Ferrous Sulfate (IRON PO) Take 1 tablet by mouth daily.   Yes [provider]  magnesium 30 MG tablet Take 30 mg by mouth daily.   Yes [provider]  Potassium 99 MG TABS Take 1 tablet by mouth daily.   Yes [provider]  spironolactone (ALDACTONE) 100 MG tablet Take 100 mg by mouth daily. 05/21/20  Yes [provider]    Physical Exam: Vitals:   05/23/20 2130 05/23/20 2132 05/23/20 2138 05/23/20 2208  BP: 126/68   132/81  Pulse: Marland Kitchen)  102 (!) 102  95  Resp: 20 20  20   Temp:   98.2 F (36.8 C) 98.5 F (36.9 C)  TempSrc:   Axillary Oral  SpO2: 95% 97%  94%    Constitutional: Awake alert and oriented x3, patient is in mild respiratory distress. Skin: Notable jaundice of the skin.  No rashes, no lesions, good skin turgor  noted. Eyes: Pupils are equally reactive to light.  No evidence of scleral icterus or conjunctival pallor.  ENMT: Somewhat dry mucous membranes noted.  Posterior pharynx clear of any exudate or lesions.   Neck: normal, supple, no masses, no thyromegaly.  No evidence of jugular venous distension.   Respiratory: Rales noted in the bilateral lower and mid fields with scattered rhonchi in all fields without any evidence of wheezing.  Normal respiratory effort. No accessory muscle use.  Cardiovascular: Tachycardic rate with regular rhythm no murmurs / rubs / gallops.  Extensive bilateral lower extremity pitting edema that tracks in the feet all the way up to the lower abdominal wall.  2+ pedal pulses. No carotid bruits.  Chest:   Nontender without crepitus or deformity.   Back:   Nontender without crepitus or deformity. Abdomen: Abdomen is protuberant but soft and nontender.  Mild fluid wave with evidence of shifting dullness on examination.  No evidence of intra-abdominal masses.  Positive bowel sounds noted in all quadrants.   Musculoskeletal: No joint deformity upper and lower extremities. Good ROM, no contractures. Normal muscle tone.  Neurologic: Notable tremor at rest.  CN 2-12 grossly intact. Sensation intact.  Patient moving all 4 extremities spontaneously.  Patient is following all commands.  Patient is responsive to verbal stimuli.   Psychiatric: Patient exhibits depressed mood with flat affect.  Patient seems to possess insight as to their current situation.     Labs on Admission: I have personally reviewed following labs and imaging studies -   CBC: Recent Labs  Lab 05/23/20 1501  WBC 19.6*  HGB 5.9*  HCT 19.2*  MCV 87.7  PLT 253   Basic Metabolic Panel: Recent Labs  Lab 05/23/20 1501 05/23/20 2203 05/23/20 2245  NA 119*  --  116*  K 3.7  --  3.3*  CL 84*  --  81*  CO2 24  --  24  GLUCOSE 114*  --  114*  BUN 5*  --  <5*  CREATININE 0.70  --  0.66  CALCIUM 8.2*  --  7.7*   MG  --  1.9  --    GFR: CrCl cannot be calculated (Unknown ideal weight.). Liver Function Tests: Recent Labs  Lab 05/23/20 2203  AST 132*  ALT 17  ALKPHOS 156*  BILITOT 6.7*  PROT 7.2  ALBUMIN 2.2*   No results for input(s): LIPASE, AMYLASE in the last 168 hours. Recent Labs  Lab 05/23/20 2203  AMMONIA 51*   Coagulation Profile: Recent Labs  Lab 05/23/20 2245  INR 1.5*   Cardiac Enzymes: No results for input(s): CKTOTAL, CKMB, CKMBINDEX, TROPONINI in the last 168 hours. BNP (last 3 results) No results for input(s): PROBNP in the last 8760 hours. HbA1C: No results for input(s): HGBA1C in the last 72 hours. CBG: No results for input(s): GLUCAP in the last 168 hours. Lipid Profile: No results for input(s): CHOL, HDL, LDLCALC, TRIG, CHOLHDL, LDLDIRECT in the last 72 hours. Thyroid Function Tests: Recent Labs    05/23/20 2206  TSH 3.911   Anemia Panel: Recent Labs    05/23/20 2203  VITAMINB12 1,427*  FOLATE 2.2*  TIBC 237*  IRON 23*   Urine analysis: No results found for: COLORURINE, APPEARANCEUR, LABSPEC, PHURINE, GLUCOSEU, HGBUR, BILIRUBINUR, KETONESUR, PROTEINUR, UROBILINOGEN, NITRITE, LEUKOCYTESUR  Radiological Exams on Admission - Personally Reviewed: DG Chest 2 View  Result Date: 05/23/2020 CLINICAL DATA:  Shortness of breath EXAM: CHEST - 2 VIEW COMPARISON:  None. FINDINGS: Cardiomegaly. There is a rounded, masslike opacity of the central right upper lobe which measures approximately 4.2 cm in projection. There is superimposed heterogeneous and interstitial airspace opacity, particularly of the perihilar left lung. Small left, trace right pleural effusions. The visualized skeletal structures are unremarkable. IMPRESSION: 1. There is a rounded, masslike opacity of the central right upper lobe which measures approximately 4.2 cm in projection. This is concerning for malignancy, although may reflect infectious airspace disease. Consider CT to further  evaluate. Absolute minimum recommend radiographic follow-up at 6-8 weeks to ensure complete resolution. 2. There is superimposed heterogeneous and interstitial airspace opacity, particularly of the perihilar left lung. Small left, trace right pleural effusions. Findings suggest edema or infection. 3. Cardiomegaly. Electronically Signed   By: Lauralyn Primes M.D.   On: 05/23/2020 15:19   CT Angio Chest PE W/Cm &/Or Wo Cm  Result Date: 05/23/2020 CLINICAL DATA:  PE suspected, low/intermediate prob, positive D-dimer Shortness of breath, possible PE, lung mass EXAM: CT ANGIOGRAPHY CHEST WITH CONTRAST TECHNIQUE: Multidetector CT imaging of the chest was performed using the standard protocol during bolus administration of intravenous contrast. Multiplanar CT image reconstructions and MIPs were obtained to evaluate the vascular anatomy. CONTRAST:  71mL OMNIPAQUE IOHEXOL 350 MG/ML SOLN COMPARISON:  Radiograph earlier today. FINDINGS: Cardiovascular: Evaluation for pulmonary embolus is diagnostic to the lobar levels in the mid and upper lung zones and segmental levels at the bases. No evidence of pulmonary embolus. Normal caliber thoracic aorta without acute aortic abnormality. Upper normal heart size. No pericardial effusion. Suggestion of scattered coronary artery calcifications, not well assessed due to motion on the current exam. Mediastinum/Nodes: Small mediastinal lymph nodes not enlarged by size criteria. No enlarged hilar lymph nodes. No axillary adenopathy. Patulous esophagus. No esophageal wall thickening. No suspicious thyroid nodule. Lungs/Pleura: There is no pulmonary mass. The masslike opacity on radiograph corresponds to a rounded area of ground-glass and consolidative opacity in the right upper lobe. Similar ground-glass and consolidative opacities within the apical and posterior segments of the right upper lobe, anterior left upper lobe, lingula, and to a lesser extent right middle and lower lobe. Some of  these ground-glass opacities appear slightly nodular. Small to moderate left pleural effusion with adjacent compressive atelectasis. Trace right pleural effusion with adjacent atelectasis. Trace fluid in the fissures. There is minimal basilar septal thickening. Upper Abdomen: Diffuse hepatic steatosis. Included liver appears enlarged. No acute upper abdominal findings. Musculoskeletal: There are no acute or suspicious osseous abnormalities. Review of the MIP images confirms the above findings. IMPRESSION: 1. No central pulmonary embolus. 2. Multifocal ground-glass and consolidative opacities throughout both lungs. Differential considerations include pulmonary edema or atypical pneumonia. 3. No pulmonary mass. Radiographic appearance in the right lung correlates to a rounded area of ground-glass and consolidative opacity in the right upper lobe, as described above. 4. Small to moderate left and trace right pleural effusions with adjacent compressive atelectasis. 5. Suspect coronary artery calcifications. 6. Incidental note of hepatic steatosis and probable hepatomegaly in the upper abdomen. Electronically Signed   By: Narda Rutherford M.D.   On: 05/23/2020 18:43    EKG: Personally reviewed.  Rhythm is sinus  tachycardia with heart rate of 103 bpm.  No dynamic ST segment changes appreciated.  Assessment/Plan Principal Problem:   Shortness of breath   Patient presenting with several week history of progressively worsening shortness of breath dyspnea on exertion and paroxysmal nocturnal dyspnea  Symptoms are multifactorial in origin, secondary to symptomatic anemia with hemoglobin of 5.9.  This is additionally in the setting of progressive liver disease with what I suspect is severe ascites preventing patient from taking a deep breath.  Finally, I am concerned for the possibility of a concurrent bilateral pneumonia with superimposed pulmonary edema.  Patient is receiving 2 units of packed red blood cells  via transfusion already ordered by the emergency department staff  Patient's profoundly volume overloaded with evidence of peripheral edema as well as pulmonary edema.  Intravenous Lasix has already been initiated which I will continue for now as blood pressure tolerates.  Patient should be transitioned over to a regimen of Lasix and spironolactone prior to discharge  Providing patient with intravenous antibiotic therapy for suspected bilateral pneumonia  Supplemental oxygen as needed for ox saturations of less than 92%.  Echocardiogram in the morning to identify any evidence of concurrent alcoholic cardiomyopathy/heart failure  Active Problems:   Symptomatic anemia   Multifactorial symptomatic anemia with presenting hemoglobin of 5.9  Patient's shortness of breath and malaise are likely at least in part due to patient's profound anemia  No prior blood work in our system to compare with a baseline  Anemia likely secondary to myelosuppression due to extremely heavy alcohol abuse in addition to concerns for upper gastrointestinal bleeding.  Considering that patient denies any gross evidence of bleeding, patient would most likely be suffering from slow bleed from gastritis or esophagitis and less likely secondary to varices at this time.  Therefore provided patient with intravenous PPI every 12 hours  Secure chat sent to gastroenterology for nonurgent consultation the morning for consideration of endoscopic work-up while hospitalized.  Will monitor hemoglobin hematocrit with serial CBCs after transfusion is complete.    Lactic acidosis   Evidence of mild lactic acidosis of 2.5  Likely multifactorial secondary to transient tissue hypoperfusion from decreased oxygen carrying capacity and ongoing infection  Providing patient with blood transfusion, treating underlying infection  Performing serial lactic acid levels to ensure downtrending and resolution    Pneumonia of both lungs  due to infectious organism   Considering patient's substantial leukocytosis and elevated inflammatory markers with elevated pro calcitonin I suspect patient has a superimposed bilateral pneumonia with concurrent pulmonary edema  Patient additionally exhibits multiple SIRS criteria however the tachycardia is more likely secondary to the significant anemia and not due to actual sepsis  Treating patient with intravenous antibiotic therapy with azithromycin and ceftriaxone  Blood cultures ordered    Essential hypertension   Continue home antihypertensive therapy as blood pressure tolerates    Hyponatremia   Patient is exhibiting substantial hyponatremia, likely secondary to "beer [place] mania" from heavy alcohol use possibly exacerbated by some degree of volume overload  Obtaining serum osmolality, urine osmolality, TSH and serum sodium to help determine etiology.  Avoiding intravenous saline infusion for now.  In fact, providing patient with intravenous Lasix due to concern for volume overload and will see how sodium trends overnight with this.  Performing serial chemistries every 4 hours to monitor degree of sodium correction, target sodium correction approximately 6-8 in 24 hours.    Elevated troponin level not due myocardial infarction   Slight elevation of troponin on arrival  EKG reveals no evidence of dynamic changes  While patient does complain of vague atypical chest pain, I do not believe that this is cardiac in origin and do not believe that this elevated troponin is due to plaque rupture  Cycling cardiac enzymes  Monitoring patient on telemetry  Alcohol dependence with uncomplicated withdrawal  Patient reports a several year history of extremely heavy alcohol use drinking half of a fifth of bourbon daily for a minimum of 8 shots  Patient is already exhibiting evidence of tremulousness and tachycardia and I believe is already a sign of early withdrawal  Initiating  patient on CIWA protocol with tapering regimen of oral Ativan with additional as needed Ativan for signs of withdrawal.    Alcoholic liver disease (HCC)  Patient exhibits evidence of alcoholic liver disease on exam and history with longstanding history of heavy drinking, evidence of ascites and jaundice on exam  Biochemical evidence of liver disease with markedly elevated bilirubin and somewhat elevated AST concerning for perhaps a degree of mild alcohol hepatitis.  Patient is additionally coagulopathic with elevated INR of 1.5.  Madreys discriminant function calculates to 16.8 suggesting that steroids are not necessary.  Managing withdrawal of alcohol as mentioned above  Patient has been advised to cease drinking alcohol immediately  Gastroenterology consultation being requested as noted above, their input is appreciated.  Additionally obtaining ultrasound of the liver and ascites survey.  Will order IR guided paracentesis if patient does exhibit significant ascites.  Code Status:  Full code Family Communication: Wife is at bedside who has been updated on plan of care  Status is: Observation  The patient remains OBS appropriate and will d/c before 2 midnights.  Dispo: The patient is from: Home              Anticipated d/c is to: Home              Patient currently is not medically stable to d/c.   Difficult to place patient No        Marinda Elk MD [facility] Pager [phone]  If 7PM-7AM, please contact night-coverage [email or web address] Use universal [place] password for that web site. If you do not have the password, please call the hospital operator.  05/23/2020, 11:29 PM

## 2020-05-23 NOTE — Progress Notes (Signed)
MD notified about lactic acid of 2.5.

## 2020-05-24 ENCOUNTER — Observation Stay (HOSPITAL_COMMUNITY): Payer: BC Managed Care – PPO

## 2020-05-24 ENCOUNTER — Other Ambulatory Visit: Payer: Self-pay

## 2020-05-24 DIAGNOSIS — D500 Iron deficiency anemia secondary to blood loss (chronic): Secondary | ICD-10-CM | POA: Diagnosis present

## 2020-05-24 DIAGNOSIS — J189 Pneumonia, unspecified organism: Secondary | ICD-10-CM | POA: Diagnosis present

## 2020-05-24 DIAGNOSIS — I509 Heart failure, unspecified: Secondary | ICD-10-CM | POA: Diagnosis not present

## 2020-05-24 DIAGNOSIS — E871 Hypo-osmolality and hyponatremia: Secondary | ICD-10-CM | POA: Diagnosis not present

## 2020-05-24 DIAGNOSIS — Z20822 Contact with and (suspected) exposure to covid-19: Secondary | ICD-10-CM | POA: Diagnosis present

## 2020-05-24 DIAGNOSIS — R778 Other specified abnormalities of plasma proteins: Secondary | ICD-10-CM | POA: Diagnosis not present

## 2020-05-24 DIAGNOSIS — I10 Essential (primary) hypertension: Secondary | ICD-10-CM | POA: Diagnosis present

## 2020-05-24 DIAGNOSIS — K922 Gastrointestinal hemorrhage, unspecified: Secondary | ICD-10-CM | POA: Diagnosis present

## 2020-05-24 DIAGNOSIS — K5730 Diverticulosis of large intestine without perforation or abscess without bleeding: Secondary | ICD-10-CM | POA: Diagnosis present

## 2020-05-24 DIAGNOSIS — R0609 Other forms of dyspnea: Secondary | ICD-10-CM | POA: Diagnosis present

## 2020-05-24 DIAGNOSIS — E872 Acidosis: Secondary | ICD-10-CM | POA: Diagnosis present

## 2020-05-24 DIAGNOSIS — J811 Chronic pulmonary edema: Secondary | ICD-10-CM | POA: Diagnosis present

## 2020-05-24 DIAGNOSIS — E876 Hypokalemia: Secondary | ICD-10-CM | POA: Diagnosis present

## 2020-05-24 DIAGNOSIS — R000 Tachycardia, unspecified: Secondary | ICD-10-CM | POA: Diagnosis present

## 2020-05-24 DIAGNOSIS — A419 Sepsis, unspecified organism: Secondary | ICD-10-CM | POA: Diagnosis present

## 2020-05-24 DIAGNOSIS — R040 Epistaxis: Secondary | ICD-10-CM | POA: Diagnosis present

## 2020-05-24 DIAGNOSIS — R0602 Shortness of breath: Secondary | ICD-10-CM

## 2020-05-24 DIAGNOSIS — R188 Other ascites: Secondary | ICD-10-CM | POA: Diagnosis not present

## 2020-05-24 DIAGNOSIS — Z79899 Other long term (current) drug therapy: Secondary | ICD-10-CM | POA: Diagnosis not present

## 2020-05-24 DIAGNOSIS — K709 Alcoholic liver disease, unspecified: Secondary | ICD-10-CM | POA: Diagnosis present

## 2020-05-24 DIAGNOSIS — Z801 Family history of malignant neoplasm of trachea, bronchus and lung: Secondary | ICD-10-CM | POA: Diagnosis not present

## 2020-05-24 DIAGNOSIS — R160 Hepatomegaly, not elsewhere classified: Secondary | ICD-10-CM | POA: Diagnosis not present

## 2020-05-24 DIAGNOSIS — K625 Hemorrhage of anus and rectum: Secondary | ICD-10-CM | POA: Diagnosis present

## 2020-05-24 DIAGNOSIS — K760 Fatty (change of) liver, not elsewhere classified: Secondary | ICD-10-CM | POA: Diagnosis not present

## 2020-05-24 DIAGNOSIS — K635 Polyp of colon: Secondary | ICD-10-CM | POA: Diagnosis present

## 2020-05-24 DIAGNOSIS — R7989 Other specified abnormal findings of blood chemistry: Secondary | ICD-10-CM | POA: Diagnosis present

## 2020-05-24 DIAGNOSIS — E8770 Fluid overload, unspecified: Secondary | ICD-10-CM | POA: Diagnosis present

## 2020-05-24 DIAGNOSIS — K8020 Calculus of gallbladder without cholecystitis without obstruction: Secondary | ICD-10-CM | POA: Diagnosis not present

## 2020-05-24 DIAGNOSIS — F10230 Alcohol dependence with withdrawal, uncomplicated: Secondary | ICD-10-CM | POA: Diagnosis not present

## 2020-05-24 LAB — CBC
HCT: 20.8 % — ABNORMAL LOW (ref 39.0–52.0)
Hemoglobin: 6.8 g/dL — CL (ref 13.0–17.0)
MCH: 28.2 pg (ref 26.0–34.0)
MCHC: 32.7 g/dL (ref 30.0–36.0)
MCV: 86.3 fL (ref 80.0–100.0)
Platelets: 251 10*3/uL (ref 150–400)
RBC: 2.41 MIL/uL — ABNORMAL LOW (ref 4.22–5.81)
RDW: 19.9 % — ABNORMAL HIGH (ref 11.5–15.5)
WBC: 19.4 10*3/uL — ABNORMAL HIGH (ref 4.0–10.5)
nRBC: 0.3 % — ABNORMAL HIGH (ref 0.0–0.2)

## 2020-05-24 LAB — COMPREHENSIVE METABOLIC PANEL
ALT: 16 U/L (ref 0–44)
AST: 130 U/L — ABNORMAL HIGH (ref 15–41)
Albumin: 2.3 g/dL — ABNORMAL LOW (ref 3.5–5.0)
Alkaline Phosphatase: 165 U/L — ABNORMAL HIGH (ref 38–126)
Anion gap: 9 (ref 5–15)
BUN: 6 mg/dL (ref 6–20)
CO2: 25 mmol/L (ref 22–32)
Calcium: 8.1 mg/dL — ABNORMAL LOW (ref 8.9–10.3)
Chloride: 86 mmol/L — ABNORMAL LOW (ref 98–111)
Creatinine, Ser: 0.62 mg/dL (ref 0.61–1.24)
GFR, Estimated: >60 mL/min (ref >60)
Glucose, Bld: 98 mg/dL (ref 70–99)
Potassium: 3.4 mmol/L — ABNORMAL LOW (ref 3.5–5.1)
Sodium: 120 mmol/L — ABNORMAL LOW (ref 135–145)
Total Bilirubin: 7.4 mg/dL — ABNORMAL HIGH (ref 0.3–1.2)
Total Protein: 7.5 g/dL (ref 6.5–8.1)

## 2020-05-24 LAB — ECHOCARDIOGRAM COMPLETE
AR max vel: 2.49 cm2
AV Area VTI: 2.51 cm2
AV Area mean vel: 2.33 cm2
AV Mean grad: 15 mmHg
AV Peak grad: 26.2 mmHg
Ao pk vel: 2.56 m/s
Area-P 1/2: 3.16 cm2
Calc EF: 75.4 %
Height: 72 in
S' Lateral: 2.6 cm
Single Plane A2C EF: 69.1 %
Single Plane A4C EF: 79.9 %
Weight: 3245.17 oz

## 2020-05-24 LAB — BASIC METABOLIC PANEL
Anion gap: 9 (ref 5–15)
Anion gap: 9 (ref 5–15)
Anion gap: 9 (ref 5–15)
BUN: 5 mg/dL — ABNORMAL LOW (ref 6–20)
BUN: 6 mg/dL (ref 6–20)
BUN: 6 mg/dL (ref 6–20)
CO2: 25 mmol/L (ref 22–32)
CO2: 25 mmol/L (ref 22–32)
CO2: 26 mmol/L (ref 22–32)
Calcium: 8.1 mg/dL — ABNORMAL LOW (ref 8.9–10.3)
Calcium: 7.9 mg/dL — ABNORMAL LOW (ref 8.9–10.3)
Calcium: 7.8 mg/dL — ABNORMAL LOW (ref 8.9–10.3)
Chloride: 87 mmol/L — ABNORMAL LOW (ref 98–111)
Chloride: 88 mmol/L — ABNORMAL LOW (ref 98–111)
Chloride: 88 mmol/L — ABNORMAL LOW (ref 98–111)
Creatinine, Ser: 0.68 mg/dL (ref 0.61–1.24)
Creatinine, Ser: 0.65 mg/dL (ref 0.61–1.24)
Creatinine, Ser: 0.7 mg/dL (ref 0.61–1.24)
GFR, Estimated: >60 mL/min (ref >60)
GFR, Estimated: >60 mL/min (ref >60)
GFR, Estimated: >60 mL/min (ref >60)
Glucose, Bld: 109 mg/dL — ABNORMAL HIGH (ref 70–99)
Glucose, Bld: 91 mg/dL (ref 70–99)
Glucose, Bld: 98 mg/dL (ref 70–99)
Potassium: 3.4 mmol/L — ABNORMAL LOW (ref 3.5–5.1)
Potassium: 3.7 mmol/L (ref 3.5–5.1)
Potassium: 3.6 mmol/L (ref 3.5–5.1)
Sodium: 121 mmol/L — ABNORMAL LOW (ref 135–145)
Sodium: 122 mmol/L — ABNORMAL LOW (ref 135–145)
Sodium: 123 mmol/L — ABNORMAL LOW (ref 135–145)

## 2020-05-24 LAB — MAGNESIUM: Magnesium: 1.9 mg/dL (ref 1.7–2.4)

## 2020-05-24 LAB — URINALYSIS, COMPLETE (UACMP) WITH MICROSCOPIC
Bilirubin Urine: NEGATIVE
Glucose, UA: NEGATIVE mg/dL
Ketones, ur: NEGATIVE mg/dL
Leukocytes,Ua: NEGATIVE
Nitrite: NEGATIVE
Protein, ur: NEGATIVE mg/dL
Specific Gravity, Urine: 1.017 (ref 1.005–1.030)
pH: 6 (ref 5.0–8.0)

## 2020-05-24 LAB — PROTIME-INR
INR: 1.5 — ABNORMAL HIGH (ref 0.8–1.2)
Prothrombin Time: 17.9 seconds — ABNORMAL HIGH (ref 11.4–15.2)

## 2020-05-24 LAB — HEMOGLOBIN AND HEMATOCRIT, BLOOD
HCT: 20.9 % — ABNORMAL LOW (ref 39.0–52.0)
Hemoglobin: 7.1 g/dL — ABNORMAL LOW (ref 13.0–17.0)

## 2020-05-24 LAB — PREPARE RBC (CROSSMATCH)

## 2020-05-24 LAB — TROPONIN I (HIGH SENSITIVITY): Troponin I (High Sensitivity): 17 ng/L (ref <18)

## 2020-05-24 LAB — SODIUM, URINE, RANDOM: Sodium, Ur: 26 mmol/L

## 2020-05-24 LAB — HIV ANTIBODY (ROUTINE TESTING W REFLEX): HIV Screen 4th Generation wRfx: NONREACTIVE

## 2020-05-24 LAB — ETHANOL: Alcohol, Ethyl (B): <10 mg/dL (ref <10)

## 2020-05-24 LAB — OSMOLALITY, URINE: Osmolality, Ur: 266 mOsm/kg — ABNORMAL LOW (ref 300–900)

## 2020-05-24 LAB — APTT: aPTT: 43 seconds — ABNORMAL HIGH (ref 24–36)

## 2020-05-24 LAB — LACTIC ACID, PLASMA: Lactic Acid, Venous: 1.4 mmol/L (ref 0.5–1.9)

## 2020-05-24 LAB — OSMOLALITY: Osmolality: 257 mOsm/kg — ABNORMAL LOW (ref 275–295)

## 2020-05-24 MED ORDER — POTASSIUM CHLORIDE CRYS ER 20 MEQ PO TBCR
40.0000 meq | EXTENDED_RELEASE_TABLET | Freq: Every day | ORAL | Status: DC
  Administered 2020-05-24: 40 meq via ORAL
  Filled 2020-05-24: qty 2

## 2020-05-24 MED ORDER — FUROSEMIDE 10 MG/ML IJ SOLN
20.0000 mg | Freq: Once | INTRAMUSCULAR | Status: AC
  Administered 2020-05-24: 20 mg via INTRAVENOUS
  Filled 2020-05-24: qty 2

## 2020-05-24 MED ORDER — SODIUM CHLORIDE 0.9% IV SOLUTION: Freq: Once | INTRAVENOUS | Status: AC

## 2020-05-24 MED ORDER — SPIRONOLACTONE 25 MG PO TABS
25.0000 mg | ORAL_TABLET | Freq: Every day | ORAL | Status: DC
  Administered 2020-05-24 – 2020-05-28 (×5): 25 mg via ORAL
  Filled 2020-05-24 (×5): qty 1

## 2020-05-24 MED ORDER — ALBUTEROL SULFATE (2.5 MG/3ML) 0.083% IN NEBU
2.5000 mg | INHALATION_SOLUTION | RESPIRATORY_TRACT | Status: DC | PRN
  Administered 2020-05-24 – 2020-05-26 (×3): 2.5 mg via RESPIRATORY_TRACT
  Filled 2020-05-24 (×4): qty 3

## 2020-05-24 MED ORDER — LACTULOSE 10 GM/15ML PO SOLN: 10.0000 g | Freq: Every day | ORAL | Status: DC | PRN

## 2020-05-24 NOTE — Progress Notes (Addendum)
Progress Note    Edward Valdez  ACZ:[id number] DOB: Mar 23, 1975  DOA: 05/23/2020 PCP: [facility]    Brief Narrative:    Medical records reviewed and are as summarized below:  Edward Valdez is an 45 y.o. male with past medical history of alcohol abuse, hypertension who presents to [facility] emergency department with dyspnea on exertion.  Patient explains that for the past 2 weeks he has been experiencing shortness of breath.  The shortness of breath was initially mild in intensity but as the days progressed shortness of breath became more and more severe.  Shortness of breath is worse with exertion and improved with rest.  Over the span of time patient is complaining of associated bilateral lower extremity edema, increasing abdominal girth, generalized malaise and weakness as well as episodes of paroxysmal nocturnal dyspnea and progressive yellowing of the skin.  Assessment/Plan:   Principal Problem:   Shortness of breath Active Problems:   Essential hypertension   Symptomatic anemia   Lactic acidosis   Hyponatremia   Pneumonia of both lungs due to infectious organism   Elevated troponin level not due myocardial infarction   Alcoholic liver disease (HCC)   Alcohol dependence with uncomplicated withdrawal (HCC)   GI bleed   Shortness of breath  Patient presenting with several week history of progressively worsening shortness of breath dyspnea on exertion and paroxysmal nocturnal dyspnea  Symptoms are multifactorial in origin, secondary to symptomatic anemia with hemoglobin of 5.9.  concurrent bilateral pneumonia with superimposed pulmonary edema.  Getting 3 units of PRBCs  IV lasix  May need aldactone/lasix upon d/c  IV abx  Supplemental oxygen as needed for ox saturations of less than 92%.  Echocardiogram pending    Symptomatic anemia  Multifactorial symptomatic anemia with presenting hemoglobin of 5.9  Patient's shortness of  breath and malaise are likely at least in part due to patient's profound anemia  No prior blood work in our system to compare with a baseline  Anemia likely secondary to myelosuppression due to extremely heavy alcohol abuse in addition to concerns for upper gastrointestinal bleeding.   intravenous PPI every 12 hours  GI consult    Lactic acidosis  resolved    Pneumonia  IV Abx  Blood cultures ordered    Essential hypertension  Continue home antihypertensive therapy as blood pressure tolerates    Hyponatremia  Patient is exhibiting substantial hyponatremia, likely secondary to "beer [place]oto mania" from heavy alcohol use possibly exacerbated by some degree of volume overload  Diurese and monitor  Alcohol dependence with uncomplicated withdrawal  Patient reports a several year history of extremely heavy alcohol use drinking half of a fifth of bourbon daily for a minimum of 8 shots  Patient is already exhibiting evidence of tremulousness and tachycardia and I believe is already a sign of early withdrawal  Initiating patient on CIWA protocol with tapering regimen of oral Ativan with additional as needed Ativan for signs of withdrawal.    Alcoholic liver disease (HCC)  Patient exhibits evidence of alcoholic liver disease on exam and history with longstanding history of heavy drinking, evidence of ascites and jaundice on exam  Biochemical evidence of liver disease with markedly elevated bilirubin and somewhat elevated AST concerning for perhaps a degree of mild alcohol hepatitis.  Patient is additionally coagulopathic with elevated INR of 1.5.  Madreys discriminant function calculates to 16.8 suggesting that steroids are not necessary.  Managing withdrawal of alcohol as mentioned above  Patient  has been advised to cease drinking alcohol immediately  Gastroenterology consultation being requested as noted above, their input is appreciated.   Elevated d dimer -CTA  negative   Needs close monitoring and perhaps a transfer to a higher level of care such as progressive   Family Communication/Anticipated D/C date and plan/Code Status   DVT prophylaxis: scd Code Status: Full Code.  Disposition Plan: Status is: Inpatient  Remains inpatient appropriate because:Inpatient level of care appropriate due to severity of illness   Dispo: The patient is from: Home              Anticipated d/c is to: Home              Patient currently is not medically stable to d/c.           Medical Consultants:    GI [address]([place])     Subjective:   Feels tremulous  Objective:    Vitals:   05/24/20 0400 05/24/20 0515 05/24/20 0729 05/24/20 1145  BP: 132/78 131/80 (!) 140/91 122/71  Pulse: (!) 103 (!) 102 (!) 105 (!) 102  Resp: 20 20 18 16   Temp: 98.7 F (37.1 C) 99.2 F (37.3 C) 98.9 F (37.2 C) 98.7 F (37.1 C)  TempSrc: Oral Oral Oral Oral  SpO2: 95% 94% 99% 94%  Weight:      Height:        Intake/Output Summary (Last 24 hours) at 05/24/2020 1208 Last data filed at 05/24/2020 0440 Gross per 24 hour  Intake 1037.63 ml  Output 200 ml  Net 837.63 ml   Filed Weights   05/23/20 2208  Weight: 92 kg    Exam:  General: Appearance:     Overweight male who appears to be withdrawing     Lungs:     respirations unlabored  Heart:    Tachycardic. Normal rhythm. No murmurs, rubs, or gallops.   MS:   All extremities are intact.   Neurologic:   Awake, alert, oriented x 3. tremulous    Data Reviewed:   I have personally reviewed following labs and imaging studies:  Labs: Labs show the following:   Basic Metabolic Panel: Recent Labs  Lab 05/23/20 1501 05/23/20 2203 05/23/20 2245 05/24/20 0516 05/24/20 0743  NA 119*  --  116* 121* 120*  K 3.7  --  3.3* 3.4* 3.4*  CL 84*  --  81* 87* 86*  CO2 24  --  24 25 25   GLUCOSE 114*  --  114* 109* 98  BUN 5*  --  <5* 5* 6  CREATININE 0.70  --  0.66 0.68 0.62  CALCIUM 8.2*  --  7.7* 8.1* 8.1*   MG  --  1.9  --   --  1.9   GFR Estimated Creatinine Clearance: 125.3 mL/min (by C-G formula based on SCr of 0.62 mg/dL). Liver Function Tests: Recent Labs  Lab 05/23/20 2203 05/24/20 0743  AST 132* 130*  ALT 17 16  ALKPHOS 156* 165*  BILITOT 6.7* 7.4*  PROT 7.2 7.5  ALBUMIN 2.2* 2.3*   No results for input(s): LIPASE, AMYLASE in the last 168 hours. Recent Labs  Lab 05/23/20 2203  AMMONIA 51*   Coagulation profile Recent Labs  Lab 05/23/20 2245 05/24/20 0743  INR 1.5* 1.5*    CBC: Recent Labs  Lab 05/23/20 1501 05/24/20 0516  WBC 19.6* 19.4*  HGB 5.9* 6.8*  HCT 19.2* 20.8*  MCV 87.7 86.3  PLT 253 251   Cardiac Enzymes: No results  for input(s): CKTOTAL, CKMB, CKMBINDEX, TROPONINI in the last 168 hours. BNP (last 3 results) No results for input(s): PROBNP in the last 8760 hours. CBG: No results for input(s): GLUCAP in the last 168 hours. D-Dimer: Recent Labs    05/23/20 1627  DDIMER 5.47*   Hgb A1c: No results for input(s): HGBA1C in the last 72 hours. Lipid Profile: No results for input(s): CHOL, HDL, LDLCALC, TRIG, CHOLHDL, LDLDIRECT in the last 72 hours. Thyroid function studies: Recent Labs    05/23/20 07-03-04  TSH 3.911   Anemia work up: Recent Labs    05/23/20 2201/07/03  VITAMINB12 1,427*  FOLATE 2.2*  TIBC 237*  IRON 23*   Sepsis Labs: Recent Labs  Lab 05/23/20 1501 05/23/20 07-03-2201 05/24/20 0516  PROCALCITON  --  0.58  --   WBC 19.6*  --  19.4*  LATICACIDVEN  --  2.5* 1.4    Microbiology Recent Results (from the past 240 hour(s))  Resp Panel by RT-PCR (Flu A&B, Covid) Nasopharyngeal Swab     Status: None   Collection Time: 05/23/20  5:13 PM   Specimen: Nasopharyngeal Swab; Nasopharyngeal(NP) swabs in vial transport medium  Result Value Ref Range Status   SARS Coronavirus 2 by RT PCR NEGATIVE NEGATIVE Final    Comment: (NOTE) SARS-CoV-2 target nucleic acids are NOT DETECTED.  The SARS-CoV-2 RNA is generally detectable in upper  respiratory specimens during the acute phase of infection. The lowest concentration of SARS-CoV-2 viral copies this assay can detect is 138 copies/mL. A negative result does not preclude SARS-Cov-2 infection and should not be used as the sole basis for treatment or other patient management decisions. A negative result may occur with  improper specimen collection/handling, submission of specimen other than nasopharyngeal swab, presence of viral mutation(s) within the areas targeted by this assay, and inadequate number of viral copies(<138 copies/mL). A negative result must be combined with clinical observations, patient history, and epidemiological information. The expected result is Negative.  Fact Sheet for Patients:  BloggerCourse.com  Fact Sheet for Healthcare Providers:  SeriousBroker.it  This test is no t yet approved or cleared by the [place] FDA and  has been authorized for detection and/or diagnosis of SARS-CoV-2 by FDA under an Emergency Use Authorization (EUA). This EUA will remain  in effect (meaning this test can be used) for the duration of the COVID-19 declaration under Section 564(b)(1) of the Act, 21 U.S.C.section 360bbb-3(b)(1), unless the authorization is terminated  or revoked sooner.       Influenza A by PCR NEGATIVE NEGATIVE Final   Influenza B by PCR NEGATIVE NEGATIVE Final    Comment: (NOTE) The Xpert Xpress SARS-CoV-2/FLU/RSV plus assay is intended as an aid in the diagnosis of influenza from Nasopharyngeal swab specimens and should not be used as a sole basis for treatment. Nasal washings and aspirates are unacceptable for Xpert Xpress SARS-CoV-2/FLU/RSV testing.  Fact Sheet for Patients: BloggerCourse.com  Fact Sheet for Healthcare Providers: SeriousBroker.it  This test is not yet approved or cleared by the [place] FDA and has been  authorized for detection and/or diagnosis of SARS-CoV-2 by FDA under an Emergency Use Authorization (EUA). This EUA will remain in effect (meaning this test can be used) for the duration of the COVID-19 declaration under Section 564(b)(1) of the Act, 21 U.S.C. section 360bbb-3(b)(1), unless the authorization is terminated or revoked.  Performed at [facility], [address] [place], [place] [address]     Procedures and diagnostic studies:  DG Chest  2 View  Result Date: 05/23/2020 CLINICAL DATA:  Shortness of breath EXAM: CHEST - 2 VIEW COMPARISON:  None. FINDINGS: Cardiomegaly. There is a rounded, masslike opacity of the central right upper lobe which measures approximately 4.2 cm in projection. There is superimposed heterogeneous and interstitial airspace opacity, particularly of the perihilar left lung. Small left, trace right pleural effusions. The visualized skeletal structures are unremarkable. IMPRESSION: 1. There is a rounded, masslike opacity of the central right upper lobe which measures approximately 4.2 cm in projection. This is concerning for malignancy, although may reflect infectious airspace disease. Consider CT to further evaluate. Absolute minimum recommend radiographic follow-up at 6-8 weeks to ensure complete resolution. 2. There is superimposed heterogeneous and interstitial airspace opacity, particularly of the perihilar left lung. Small left, trace right pleural effusions. Findings suggest edema or infection. 3. Cardiomegaly. Electronically Signed   By: Lauralyn Primes M.D.   On: 05/23/2020 15:19   CT Angio Chest PE W/Cm &/Or Wo Cm  Result Date: 05/23/2020 CLINICAL DATA:  PE suspected, low/intermediate prob, positive D-dimer Shortness of breath, possible PE, lung mass EXAM: CT ANGIOGRAPHY CHEST WITH CONTRAST TECHNIQUE: Multidetector CT imaging of the chest was performed using the standard protocol during bolus administration of intravenous contrast. Multiplanar CT  image reconstructions and MIPs were obtained to evaluate the vascular anatomy. CONTRAST:  75mL OMNIPAQUE IOHEXOL 350 MG/ML SOLN COMPARISON:  Radiograph earlier today. FINDINGS: Cardiovascular: Evaluation for pulmonary embolus is diagnostic to the lobar levels in the mid and upper lung zones and segmental levels at the bases. No evidence of pulmonary embolus. Normal caliber thoracic aorta without acute aortic abnormality. Upper normal heart size. No pericardial effusion. Suggestion of scattered coronary artery calcifications, not well assessed due to motion on the current exam. Mediastinum/Nodes: Small mediastinal lymph nodes not enlarged by size criteria. No enlarged hilar lymph nodes. No axillary adenopathy. Patulous esophagus. No esophageal wall thickening. No suspicious thyroid nodule. Lungs/Pleura: There is no pulmonary mass. The masslike opacity on radiograph corresponds to a rounded area of ground-glass and consolidative opacity in the right upper lobe. Similar ground-glass and consolidative opacities within the apical and posterior segments of the right upper lobe, anterior left upper lobe, lingula, and to a lesser extent right middle and lower lobe. Some of these ground-glass opacities appear slightly nodular. Small to moderate left pleural effusion with adjacent compressive atelectasis. Trace right pleural effusion with adjacent atelectasis. Trace fluid in the fissures. There is minimal basilar septal thickening. Upper Abdomen: Diffuse hepatic steatosis. Included liver appears enlarged. No acute upper abdominal findings. Musculoskeletal: There are no acute or suspicious osseous abnormalities. Review of the MIP images confirms the above findings. IMPRESSION: 1. No central pulmonary embolus. 2. Multifocal ground-glass and consolidative opacities throughout both lungs. Differential considerations include pulmonary edema or atypical pneumonia. 3. No pulmonary mass. Radiographic appearance in the right lung  correlates to a rounded area of ground-glass and consolidative opacity in the right upper lobe, as described above. 4. Small to moderate left and trace right pleural effusions with adjacent compressive atelectasis. 5. Suspect coronary artery calcifications. 6. Incidental note of hepatic steatosis and probable hepatomegaly in the upper abdomen. Electronically Signed   By: Narda Rutherford M.D.   On: 05/23/2020 18:43   [place] ASCITES (ABDOMEN LIMITED)  Result Date: 05/24/2020 CLINICAL DATA:  Ascites check EXAM: LIMITED ABDOMEN ULTRASOUND FOR ASCITES TECHNIQUE: Limited ultrasound survey for ascites was performed in all four abdominal quadrants. COMPARISON:  None. FINDINGS: Small volume ascites predominantly located within the right upper quadrant  along the liver without pocket measuring approximately 6.4 cm in diameter. IMPRESSION: Small volume ascites predominantly located within the right upper quadrant. Electronically Signed   By: Maudry Mayhew MD   On: 05/24/2020 00:14   US Abdomen Limited RUQ (LIVER/GB)  Result Date: 05/24/2020 CLINICAL DATA:  Cirrhosis, ascites EXAM: ULTRASOUND ABDOMEN LIMITED RIGHT UPPER QUADRANT COMPARISON:  None. FINDINGS: Gallbladder: A small amount of layering sludge is seen within the gallbladder lumen. The gallbladder, however, is not distended and there is no gallbladder wall thickening identified. There is trace pericholecystic fluid, nonspecific in the setting of ascites. The sonographic Eulah Pont sign is reportedly negative. Common bile duct: Diameter: 3 mm in proximal diameter Liver: The hepatic parenchyma is diffusely markedly echogenic and there is poor acoustic through transmission and diffuse coarsening of the hepatic echotexture. Altogether, the findings are in keeping with moderate to severe hepatic steatosis. Liver size is mildly enlarged. No definite focal intrahepatic masses are identified though parenchymal changes decreased sensitivity for detection of such masses. There  is no intrahepatic biliary ductal dilation identified. Portal vein is patent on color Doppler imaging with normal direction of blood flow towards the liver. Other: Trace perihepatic ascites is noted. IMPRESSION: Cholelithiasis without sonographic evidence of acute cholecystitis. Moderate to severe hepatic steatosis.  Mild hepatomegaly. Mild perihepatic ascites. Electronically Signed   By: Helyn Numbers MD   On: 05/24/2020 07:28    Medications:   . sodium chloride   Intravenous Once  . amLODipine  10 mg Oral Daily  . dextroamphetamine  20 mg Oral BID  . folic acid  1 mg Oral Daily  . furosemide  40 mg Intravenous BID  . LORazepam  0-4 mg Oral Q6H   Followed by  . [START ON 05/26/2020] LORazepam  0-4 mg Oral Q12H  . multivitamin with minerals  1 tablet Oral Daily  . pantoprazole (PROTONIX) IV  40 mg Intravenous Q12H  . potassium chloride  40 mEq Oral Daily  . thiamine  100 mg Oral Daily   Or  . thiamine  100 mg Intravenous Daily   Continuous Infusions: . azithromycin    . cefTRIAXone (ROCEPHIN)  IV       LOS: 0 days   Joseph Art  [facility]   How to contact the [facility] Attending or Consulting provider 7A - 7P or covering provider during after hours 7P -7A, for this patient?  1. Check the care team in [facility] and look for a) attending/consulting [facility] provider listed and b) the [facility] team listed 2. Log into [email or web address] and use [place]'s universal password to access. If you do not have the password, please contact the hospital operator. 3. Locate the [facility] provider you are looking for under [facility] and page to a number that you can be directly reached. 4. If you still have difficulty reaching the provider, please page the [facility] (Director on Call) for the Hospitalists listed on amion for assistance.  05/24/2020, 12:08 PM

## 2020-05-24 NOTE — Consult Note (Signed)
UNASSIGNED PATIENT Reason for Consult: Symptomatic anemia. Referring Physician: [facility] hospitalist. Edward Valdez is an [age] y.o. male.  HPI: Mr. Edward Valdez is a 45 year old white male admitted to the hospital yesterday with worsening shortness of breath bilateral lower extremity edema and increasing abdominal girth with generalized malaise and weakness and progressive jaundice. GI consult was requested for an upper endoscopy but when I went to examine the patient he was acutely short of breath with increased work of breathing and was very uncomfortable due to the dyspnea. His wife was at the bedside. Patient has been consuming over the fifth of bourbon every 2 days for several years.  He denies having any melena hematochezia. He has had some abdominal discomfort in the periumbilical area.  He was found to have severe hyponatremia on admission complicated by alcohol withdrawal and lactic acidosis.  His wife tells me has been having multiple nosebleeds that last for 3 to 4 hours over the last couple months and has been having averaging about 4-5 nosebleeds per week. She suspects these noted nosebleeds could be contributing to his anemia. He also had Covid in January, this year.  Chest x-ray revealed bilateral infiltrates concerning for combination of pneumonia and pulmonary edema.  Past Medical History:  Diagnosis Date  . ADD (attention deficit disorder)   . CHF (congestive heart failure) (HCC)   . Hypertension    History reviewed. No pertinent surgical history.  Family History  Problem Relation Age of Onset  . Lung cancer Mother    Social History:  reports that he has never smoked. He has never used smokeless tobacco. He reports current alcohol use of about 56.0 standard drinks of alcohol per week. He reports current drug use. Drug: Marijuana.  Allergies: No Known Allergies  Medications: I have reviewed the patient's current medications.  Results for orders placed or performed during the  hospital encounter of 05/23/20 (from the past 48 hour(s))  Basic metabolic panel     Status: Abnormal   Collection Time: 05/23/20  3:01 PM  Result Value Ref Range   Sodium 119 (LL) 135 - 145 mmol/L    Comment: CRITICAL RESULT CALLED TO, READ BACK BY AND VERIFIED WITH: ANNIE OLEARY RN.@[id number]  ON 3.27.22 BY TCALDWELL MT.    Potassium 3.7 3.5 - 5.1 mmol/L   Chloride 84 (L) 98 - 111 mmol/L   CO2 24 22 - 32 mmol/L   Glucose, Bld 114 (H) 70 - 99 mg/dL    Comment: Glucose reference range applies only to samples taken after fasting for at least 8 hours.   BUN 5 (L) 6 - 20 mg/dL   Creatinine, Ser 1.610.70 0.61 - 1.24 mg/dL   Calcium 8.2 (L) 8.9 - 10.3 mg/dL   GFR, Estimated >09>60 >60>60 mL/min    Comment: (NOTE) Calculated using the CKD-EPI Creatinine Equation (2021)    Anion gap 11 5 - 15    Comment: Performed at [facility], [address] [place], [place] [id number]  CBC     Status: Abnormal   Collection Time: 05/23/20  3:01 PM  Result Value Ref Range   WBC 19.6 (H) 4.0 - 10.5 K/uL   RBC 2.19 (L) 4.22 - 5.81 MIL/uL   Hemoglobin 5.9 (LL) 13.0 - 17.0 g/dL    Comment: REPEATED TO VERIFY THIS CRITICAL RESULT HAS VERIFIED AND BEEN CALLED TO A ALERY RN BY KYUNG BAEK ON 03 27 2022 AT 1648, AND HAS BEEN READ BACK.     HCT 19.2 (L) 39.0 -  52.0 %   MCV 87.7 80.0 - 100.0 fL   MCH 26.9 26.0 - 34.0 pg   MCHC 30.7 30.0 - 36.0 g/dL   RDW 16.1 (H) 09.6 - 04.5 %   Platelets 253 150 - 400 K/uL   nRBC 0.2 0.0 - 0.2 %    Comment: Performed at [facility], [address] [place], [place] [address]  Brain natriuretic peptide     Status: Abnormal   Collection Time: 05/23/20  3:01 PM  Result Value Ref Range   B Natriuretic Peptide 329.2 (H) 0.0 - 100.0 pg/mL    Comment: Performed at [facility], [address] [place], [place] [address]  D-dimer, quantitative     Status: Abnormal   Collection Time: 05/23/20  4:27 PM  Result Value Ref Range   D-Dimer, Quant 5.47 (H) 0.00 - 0.50 ug/mL-FEU     Comment: (NOTE) At the manufacturer cut-off value of 0.5 g/mL FEU, this assay has a negative predictive value of 95-100%.This assay is intended for use in conjunction with a clinical pretest probability (PTP) assessment model to exclude pulmonary embolism (PE) and deep venous thrombosis (DVT) in outpatients suspected of PE or DVT. Results should be correlated with clinical presentation. Performed at [facility], [address] [place], [place] [address]   Prepare RBC (crossmatch)     Status: None   Collection Time: 05/23/20  4:58 PM  Result Value Ref Range   Order Confirmation      ORDER PROCESSED BY BLOOD BANK Performed at [facility], [address], [place], [place] [address]   Resp Panel by RT-PCR (Flu A&B, Covid) Nasopharyngeal Swab     Status: None   Collection Time: 05/23/20  5:13 PM   Specimen: Nasopharyngeal Swab; Nasopharyngeal(NP) swabs in vial transport medium  Result Value Ref Range   SARS Coronavirus 2 by RT PCR NEGATIVE NEGATIVE    Comment: (NOTE) SARS-CoV-2 target nucleic acids are NOT DETECTED.  The SARS-CoV-2 RNA is generally detectable in upper respiratory specimens during the acute phase of infection. The lowest concentration of SARS-CoV-2 viral copies this assay can detect is 138 copies/mL. A negative result does not preclude SARS-Cov-2 infection and should not be used as the sole basis for treatment or other patient management decisions. A negative result may occur with  improper specimen collection/handling, submission of specimen other than nasopharyngeal swab, presence of viral mutation(s) within the areas targeted by this assay, and inadequate number of viral copies(<138 copies/mL). A negative result must be combined with clinical observations, patient history, and epidemiological information. The expected result is Negative.  Fact Sheet for Patients:  BloggerCourse.com  Fact Sheet for Healthcare Providers:   SeriousBroker.it  This test is no t yet approved or cleared by the [place] FDA and  has been authorized for detection and/or diagnosis of SARS-CoV-2 by FDA under an Emergency Use Authorization (EUA). This EUA will remain  in effect (meaning this test can be used) for the duration of the COVID-19 declaration under Section 564(b)(1) of the Act, 21 U.S.C.section 360bbb-3(b)(1), unless the authorization is terminated  or revoked sooner.       Influenza A by PCR NEGATIVE NEGATIVE   Influenza B by PCR NEGATIVE NEGATIVE    Comment: (NOTE) The Xpert Xpress SARS-CoV-2/FLU/RSV plus assay is intended as an aid in the diagnosis of influenza from Nasopharyngeal swab specimens and should not be used as a sole basis for treatment. Nasal washings and aspirates are unacceptable  for Xpert Xpress SARS-CoV-2/FLU/RSV testing.  Fact Sheet for Patients: BloggerCourse.com  Fact Sheet for Healthcare Providers: SeriousBroker.it  This test is not yet approved or cleared by the [place] FDA and has been authorized for detection and/or diagnosis of SARS-CoV-2 by FDA under an Emergency Use Authorization (EUA). This EUA will remain in effect (meaning this test can be used) for the duration of the COVID-19 declaration under Section 564(b)(1) of the Act, 21 U.S.C. section 360bbb-3(b)(1), unless the authorization is terminated or revoked.  Performed at [facility], [address], [place], [place] [address]   Type and screen     Status: None (Preliminary result)   Collection Time: 05/23/20  5:15 PM  Result Value Ref Range   ABO/RH(D) A POS    Antibody Screen NEG    Sample Expiration 05/26/2020,2359    Unit Number [id number]    Blood Component Type RED CELLS,LR    Unit division 00    Status of Unit ISSUED,FINAL    Transfusion Status OK TO TRANSFUSE    Crossmatch Result Compatible    Unit Number  [id number]    Blood Component Type RED CELLS,LR    Unit division 00    Status of Unit ISSUED    Transfusion Status OK TO TRANSFUSE    Crossmatch Result Compatible    Unit Number [id number]    Blood Component Type RED CELLS,LR    Unit division 00    Status of Unit ISSUED    Transfusion Status OK TO TRANSFUSE    Crossmatch Result      Compatible Performed at [facility], [address] [place], [place] [address]   ABO/Rh     Status: None   Collection Time: 05/23/20  7:00 PM  Result Value Ref Range   ABO/RH(D)      A POS Performed at [facility], [address] [place], [place] [address]   POC occult blood, ED Provider will collect     Status: Abnormal   Collection Time: 05/23/20  8:03 PM  Result Value Ref Range   Fecal Occult Bld POSITIVE (A) NEGATIVE  Troponin I (High Sensitivity)     Status: Abnormal   Collection Time: 05/23/20 10:03 PM  Result Value Ref Range   Troponin I (High Sensitivity) 19 (H) <18 ng/L    Comment: (NOTE) Elevated high sensitivity troponin I (hsTnI) values and significant  changes across serial measurements may suggest ACS but many other  chronic and acute conditions are known to elevate hsTnI results.  Refer to the "Links" section for chest pain algorithms and additional  guidance. Performed at [facility], [address], [place], [place] [address]   Magnesium     Status: None   Collection Time: 05/23/20 10:03 PM  Result Value Ref Range   Magnesium 1.9 1.7 - 2.4 mg/dL    Comment: Performed at [facility], [address] [place], [place] [address]  Hepatic function panel     Status: Abnormal   Collection Time: 05/23/20 10:03 PM  Result Value Ref Range   Total Protein 7.2 6.5 - 8.1 g/dL   Albumin 2.2 (L) 3.5 - 5.0 g/dL   AST 425 (H) 15 - 41 U/L   ALT 17 0 - 44 U/L   Alkaline Phosphatase 156 (H) 38 - 126 U/L   Total Bilirubin 6.7 (H) 0.3 - 1.2 mg/dL   Bilirubin, Direct 3.5 (H) 0.0 - 0.2 mg/dL   Indirect  Bilirubin  3.2 (H) 0.3 - 0.9 mg/dL    Comment: Performed at [facility], [address] [place], [place] [address]  Vitamin B12     Status: Abnormal   Collection Time: 05/23/20 10:03 PM  Result Value Ref Range   Vitamin B-12 1,427 (H) 180 - 914 pg/mL    Comment: (NOTE) This assay is not validated for testing neonatal or myeloproliferative syndrome specimens for Vitamin B12 levels. Performed at [facility], [address], [place], [place] [address]   Folate     Status: Abnormal   Collection Time: 05/23/20 10:03 PM  Result Value Ref Range   Folate 2.2 (L) >5.9 ng/mL    Comment: Performed at [facility], [address] [place], [place] [address]  Iron and TIBC     Status: Abnormal   Collection Time: 05/23/20 10:03 PM  Result Value Ref Range   Iron 23 (L) 45 - 182 ug/dL   TIBC 629 (L) 528 - 413 ug/dL   Saturation Ratios 10 (L) 17.9 - 39.5 %   UIBC 214 ug/dL    Comment: Performed at [facility], [address] [place], [place] [address]  Procalcitonin - Baseline     Status: None   Collection Time: 05/23/20 10:03 PM  Result Value Ref Range   Procalcitonin 0.58 ng/mL    Comment:        Interpretation: PCT > 0.5 ng/mL and <= 2 ng/mL: Systemic infection (sepsis) is possible, but other conditions are known to elevate PCT as well. (NOTE)       Sepsis PCT Algorithm           Lower Respiratory Tract                                      Infection PCT Algorithm    ----------------------------     ----------------------------         PCT < 0.25 ng/mL                PCT < 0.10 ng/mL          Strongly encourage             Strongly discourage   discontinuation of antibiotics    initiation of antibiotics    ----------------------------     -----------------------------       PCT 0.25 - 0.50 ng/mL            PCT 0.10 - 0.25 ng/mL               OR       >80% decrease in PCT            Discourage initiation of                                             antibiotics      Encourage discontinuation           of antibiotics    ----------------------------     -----------------------------         PCT >= 0.50 ng/mL              PCT 0.26 - 0.50 ng/mL                AND       <  80% decrease in PCT             Encourage initiation of                                             antibiotics       Encourage continuation           of antibiotics    ----------------------------     -----------------------------        PCT >= 0.50 ng/mL                  PCT > 0.50 ng/mL               AND         increase in PCT                  Strongly encourage                                      initiation of antibiotics    Strongly encourage escalation           of antibiotics                                     -----------------------------                                           PCT <= 0.25 ng/mL                                                 OR                                        > 80% decrease in PCT                                      Discontinue / Do not initiate                                             antibiotics  Performed at [facility], [address], [place], [place] [address]   C-reactive protein     Status: Abnormal   Collection Time: 05/23/20 10:03 PM  Result Value Ref Range   CRP 13.1 (H) <1.0 mg/dL    Comment: Performed at [facility], [address], [place], [place] [address]  Ammonia     Status: Abnormal   Collection Time: 05/23/20 10:03 PM  Result Value Ref Range   Ammonia 51 (H) 9 - 35 umol/L    Comment: Performed at [facility], [address] [place], [place] [address]  Lactic acid,  plasma     Status: Abnormal   Collection Time: 05/23/20 10:03 PM  Result Value Ref Range   Lactic Acid, Venous 2.5 (HH) 0.5 - 1.9 mmol/L    Comment: CRITICAL RESULT CALLED TO, READ BACK BY AND VERIFIED WITH: Elsworth Soho 05/23/20 2257 WAYK Performed at [facility], [address], [place], [place]  [address]   TSH     Status: None   Collection Time: 05/23/20 10:06 PM  Result Value Ref Range   TSH 3.911 0.350 - 4.500 uIU/mL    Comment: Performed by a 3rd Generation assay with a functional sensitivity of <=0.01 uIU/mL. Performed at [facility], [address] [place], [place] [address]   Protime-INR     Status: Abnormal   Collection Time: 05/23/20 10:45 PM  Result Value Ref Range   Prothrombin Time 17.4 (H) 11.4 - 15.2 seconds   INR 1.5 (H) 0.8 - 1.2    Comment: (NOTE) INR goal varies based on device and disease states. Performed at [facility], [address], [place], [place] [address]   APTT     Status: Abnormal   Collection Time: 05/23/20 10:45 PM  Result Value Ref Range   aPTT 42 (H) 24 - 36 seconds    Comment:        IF BASELINE aPTT IS ELEVATED, SUGGEST PATIENT RISK ASSESSMENT BE USED TO DETERMINE APPROPRIATE ANTICOAGULANT THERAPY. Performed at [facility], [address] [place], [place] [address]   Basic metabolic panel     Status: Abnormal   Collection Time: 05/23/20 10:45 PM  Result Value Ref Range   Sodium 116 (LL) 135 - 145 mmol/L    Comment: CRITICAL RESULT CALLED TO, READ BACK BY AND VERIFIED WITH: OFORIO E,RN 05/23/20 2316 WAYK    Potassium 3.3 (L) 3.5 - 5.1 mmol/L   Chloride 81 (L) 98 - 111 mmol/L   CO2 24 22 - 32 mmol/L   Glucose, Bld 114 (H) 70 - 99 mg/dL    Comment: Glucose reference range applies only to samples taken after fasting for at least 8 hours.   BUN <5 (L) 6 - 20 mg/dL   Creatinine, Ser 1.63 0.61 - 1.24 mg/dL   Calcium 7.7 (L) 8.9 - 10.3 mg/dL   GFR, Estimated >84 >53 mL/min    Comment: (NOTE) Calculated using the CKD-EPI Creatinine Equation (2021)    Anion gap 11 5 - 15    Comment: Performed at [facility], [address], [place], [place] [address]  Osmolality, urine     Status: Abnormal   Collection Time: 05/24/20 12:24 AM  Result Value Ref Range   Osmolality, Ur 266 (L) 300 - 900 mOsm/kg     Comment: Performed at [facility], [address] [place], [place] [address]  Sodium, urine, random     Status: None   Collection Time: 05/24/20 12:24 AM  Result Value Ref Range   Sodium, Ur 26 mmol/L    Comment: Performed at [facility], [address] [place], [place] [address]  Urinalysis, Complete w Microscopic     Status: Abnormal   Collection Time: 05/24/20 12:24 AM  Result Value Ref Range   Color, Urine AMBER (A) YELLOW    Comment: BIOCHEMICALS MAY BE AFFECTED BY COLOR   APPearance CLEAR CLEAR   Specific Gravity, Urine 1.017 1.005 - 1.030   pH 6.0 5.0 - 8.0   Glucose, UA NEGATIVE NEGATIVE mg/dL  Hgb urine dipstick SMALL (A) NEGATIVE   Bilirubin Urine NEGATIVE NEGATIVE   Ketones, ur NEGATIVE NEGATIVE mg/dL   Protein, ur NEGATIVE NEGATIVE mg/dL   Nitrite NEGATIVE NEGATIVE   Leukocytes,Ua NEGATIVE NEGATIVE   RBC / HPF 0-5 0 - 5 RBC/hpf   WBC, UA 0-5 0 - 5 WBC/hpf   Bacteria, UA RARE (A) NONE SEEN   Mucus PRESENT    Hyaline Casts, UA PRESENT     Comment: Performed at [facility], [address], [place], [place] [address]  Basic metabolic panel     Status: Abnormal   Collection Time: 05/24/20  5:16 AM  Result Value Ref Range   Sodium 121 (L) 135 - 145 mmol/L   Potassium 3.4 (L) 3.5 - 5.1 mmol/L   Chloride 87 (L) 98 - 111 mmol/L   CO2 25 22 - 32 mmol/L   Glucose, Bld 109 (H) 70 - 99 mg/dL    Comment: Glucose reference range applies only to samples taken after fasting for at least 8 hours.   BUN 5 (L) 6 - 20 mg/dL   Creatinine, Ser 6.04 0.61 - 1.24 mg/dL   Calcium 8.1 (L) 8.9 - 10.3 mg/dL   GFR, Estimated >54 >09 mL/min    Comment: (NOTE) Calculated using the CKD-EPI Creatinine Equation (2021)    Anion gap 9 5 - 15    Comment: Performed at [facility], [address] [place], [place] [address]  Osmolality     Status: Abnormal   Collection Time: 05/24/20  5:16 AM  Result Value Ref Range   Osmolality 257 (L) 275 - 295 mOsm/kg    Comment:  Performed at [facility], [address] [place], [place] [address]  HIV Antibody (routine testing w rflx)     Status: None   Collection Time: 05/24/20  5:16 AM  Result Value Ref Range   HIV Screen 4th Generation wRfx Non Reactive Non Reactive    Comment: Performed at [facility], [address], [place], [place] [address]  CBC     Status: Abnormal   Collection Time: 05/24/20  5:16 AM  Result Value Ref Range   WBC 19.4 (H) 4.0 - 10.5 K/uL   RBC 2.41 (L) 4.22 - 5.81 MIL/uL   Hemoglobin 6.8 (LL) 13.0 - 17.0 g/dL    Comment: REPEATED TO VERIFY THIS CRITICAL RESULT HAS VERIFIED AND BEEN CALLED TO M [facility] RN BY KYUNG BAEK ON 03 28 2022 AT 1119, AND HAS BEEN READ BACK.     HCT 20.8 (L) 39.0 - 52.0 %   MCV 86.3 80.0 - 100.0 fL   MCH 28.2 26.0 - 34.0 pg   MCHC 32.7 30.0 - 36.0 g/dL   RDW 08.6 (H) 57.8 - 46.9 %   Platelets 251 150 - 400 K/uL   nRBC 0.3 (H) 0.0 - 0.2 %    Comment: Performed at [facility], [address] [place], [place] [address]  Lactic acid, plasma     Status: None   Collection Time: 05/24/20  5:16 AM  Result Value Ref Range   Lactic Acid, Venous 1.4 0.5 - 1.9 mmol/L    Comment: Performed at [facility], [address], [place], [place] [address]  Troponin I (High Sensitivity)     Status: None   Collection Time: 05/24/20  5:16 AM  Result Value Ref Range   Troponin I (High Sensitivity) 17 <18 ng/L    Comment: (NOTE)  Elevated high sensitivity troponin I (hsTnI) values and significant  changes across serial measurements may suggest ACS but many other  chronic and acute conditions are known to elevate hsTnI results.  Refer to the "Links" section for chest pain algorithms and additional  guidance. Performed at [facility], [address] [place], [place] [address]   Comprehensive metabolic panel     Status: Abnormal   Collection Time: 05/24/20  7:43 AM  Result Value Ref Range   Sodium 120 (L) 135 - 145 mmol/L   Potassium 3.4 (L)  3.5 - 5.1 mmol/L   Chloride 86 (L) 98 - 111 mmol/L   CO2 25 22 - 32 mmol/L   Glucose, Bld 98 70 - 99 mg/dL    Comment: Glucose reference range applies only to samples taken after fasting for at least 8 hours.   BUN 6 6 - 20 mg/dL   Creatinine, Ser 6.04 0.61 - 1.24 mg/dL   Calcium 8.1 (L) 8.9 - 10.3 mg/dL   Total Protein 7.5 6.5 - 8.1 g/dL   Albumin 2.3 (L) 3.5 - 5.0 g/dL   AST 540 (H) 15 - 41 U/L   ALT 16 0 - 44 U/L   Alkaline Phosphatase 165 (H) 38 - 126 U/L   Total Bilirubin 7.4 (H) 0.3 - 1.2 mg/dL   GFR, Estimated >98 >11 mL/min    Comment: (NOTE) Calculated using the CKD-EPI Creatinine Equation (2021)    Anion gap 9 5 - 15    Comment: Performed at [facility], [address], [place], [place] [address]  Magnesium     Status: None   Collection Time: 05/24/20  7:43 AM  Result Value Ref Range   Magnesium 1.9 1.7 - 2.4 mg/dL    Comment: Performed at [facility], [address], [place], [place] [address]  APTT     Status: Abnormal   Collection Time: 05/24/20  7:43 AM  Result Value Ref Range   aPTT 43 (H) 24 - 36 seconds    Comment:        IF BASELINE aPTT IS ELEVATED, SUGGEST PATIENT RISK ASSESSMENT BE USED TO DETERMINE APPROPRIATE ANTICOAGULANT THERAPY. Performed at [facility], [address], [place], [place] [address]   Protime-INR     Status: Abnormal   Collection Time: 05/24/20  7:43 AM  Result Value Ref Range   Prothrombin Time 17.9 (H) 11.4 - 15.2 seconds   INR 1.5 (H) 0.8 - 1.2    Comment: (NOTE) INR goal varies based on device and disease states. Performed at [facility], [address], [place], [place] [address]   Ethanol     Status: None   Collection Time: 05/24/20  7:43 AM  Result Value Ref Range   Alcohol, Ethyl (B) <10 <10 mg/dL    Comment: (NOTE) Lowest detectable limit for serum alcohol is 10 mg/dL.  For medical purposes only. Performed at [facility], [address], [place], [place] [address]   Prepare RBC  (crossmatch)     Status: None   Collection Time: 05/24/20 11:23 AM  Result Value Ref Range   Order Confirmation      ORDER PROCESSED BY BLOOD BANK Performed at [facility], [address], [place], [place] [address]     DG Chest 2 View  Result Date: 05/23/2020 CLINICAL DATA:  Shortness of breath EXAM: CHEST - 2 VIEW COMPARISON:  None. FINDINGS: Cardiomegaly. There is a rounded,  masslike opacity of the central right upper lobe which measures approximately 4.2 cm in projection. There is superimposed heterogeneous and interstitial airspace opacity, particularly of the perihilar left lung. Small left, trace right pleural effusions. The visualized skeletal structures are unremarkable. IMPRESSION: 1. There is a rounded, masslike opacity of the central right upper lobe which measures approximately 4.2 cm in projection. This is concerning for malignancy, although may reflect infectious airspace disease. Consider CT to further evaluate. Absolute minimum recommend radiographic follow-up at 6-8 weeks to ensure complete resolution. 2. There is superimposed heterogeneous and interstitial airspace opacity, particularly of the perihilar left lung. Small left, trace right pleural effusions. Findings suggest edema or infection. 3. Cardiomegaly. Electronically Signed   By: Lauralyn Primes M.D.   On: 05/23/2020 15:19   CT Angio Chest PE W/Cm &/Or Wo Cm  Result Date: 05/23/2020 CLINICAL DATA:  PE suspected, low/intermediate prob, positive D-dimer Shortness of breath, possible PE, lung mass EXAM: CT ANGIOGRAPHY CHEST WITH CONTRAST TECHNIQUE: Multidetector CT imaging of the chest was performed using the standard protocol during bolus administration of intravenous contrast. Multiplanar CT image reconstructions and MIPs were obtained to evaluate the vascular anatomy. CONTRAST:  75mL OMNIPAQUE IOHEXOL 350 MG/ML SOLN COMPARISON:  Radiograph earlier today. FINDINGS: Cardiovascular: Evaluation for pulmonary embolus is  diagnostic to the lobar levels in the mid and upper lung zones and segmental levels at the bases. No evidence of pulmonary embolus. Normal caliber thoracic aorta without acute aortic abnormality. Upper normal heart size. No pericardial effusion. Suggestion of scattered coronary artery calcifications, not well assessed due to motion on the current exam. Mediastinum/Nodes: Small mediastinal lymph nodes not enlarged by size criteria. No enlarged hilar lymph nodes. No axillary adenopathy. Patulous esophagus. No esophageal wall thickening. No suspicious thyroid nodule. Lungs/Pleura: There is no pulmonary mass. The masslike opacity on radiograph corresponds to a rounded area of ground-glass and consolidative opacity in the right upper lobe. Similar ground-glass and consolidative opacities within the apical and posterior segments of the right upper lobe, anterior left upper lobe, lingula, and to a lesser extent right middle and lower lobe. Some of these ground-glass opacities appear slightly nodular. Small to moderate left pleural effusion with adjacent compressive atelectasis. Trace right pleural effusion with adjacent atelectasis. Trace fluid in the fissures. There is minimal basilar septal thickening. Upper Abdomen: Diffuse hepatic steatosis. Included liver appears enlarged. No acute upper abdominal findings. Musculoskeletal: There are no acute or suspicious osseous abnormalities. Review of the MIP images confirms the above findings. IMPRESSION: 1. No central pulmonary embolus. 2. Multifocal ground-glass and consolidative opacities throughout both lungs. Differential considerations include pulmonary edema or atypical pneumonia. 3. No pulmonary mass. Radiographic appearance in the right lung correlates to a rounded area of ground-glass and consolidative opacity in the right upper lobe, as described above. 4. Small to moderate left and trace right pleural effusions with adjacent compressive atelectasis. 5. Suspect  coronary artery calcifications. 6. Incidental note of hepatic steatosis and probable hepatomegaly in the upper abdomen. Electronically Signed   By: Narda Rutherford M.D.   On: 05/23/2020 18:43   [place] ASCITES (ABDOMEN LIMITED)  Result Date: 05/24/2020 CLINICAL DATA:  Ascites check EXAM: LIMITED ABDOMEN ULTRASOUND FOR ASCITES TECHNIQUE: Limited ultrasound survey for ascites was performed in all four abdominal quadrants. COMPARISON:  None. FINDINGS: Small volume ascites predominantly located within the right upper quadrant along the liver without pocket measuring approximately 6.4 cm in diameter. IMPRESSION: Small volume ascites predominantly located within the right upper quadrant. Electronically Signed  By: Maudry Mayhew MD   On: 05/24/2020 00:14   US Abdomen Limited RUQ (LIVER/GB)  Result Date: 05/24/2020 CLINICAL DATA:  Cirrhosis, ascites EXAM: ULTRASOUND ABDOMEN LIMITED RIGHT UPPER QUADRANT COMPARISON:  None. FINDINGS: Gallbladder: A small amount of layering sludge is seen within the gallbladder lumen. The gallbladder, however, is not distended and there is no gallbladder wall thickening identified. There is trace pericholecystic fluid, nonspecific in the setting of ascites. The sonographic Eulah Pont sign is reportedly negative. Common bile duct: Diameter: 3 mm in proximal diameter Liver: The hepatic parenchyma is diffusely markedly echogenic and there is poor acoustic through transmission and diffuse coarsening of the hepatic echotexture. Altogether, the findings are in keeping with moderate to severe hepatic steatosis. Liver size is mildly enlarged. No definite focal intrahepatic masses are identified though parenchymal changes decreased sensitivity for detection of such masses. There is no intrahepatic biliary ductal dilation identified. Portal vein is patent on color Doppler imaging with normal direction of blood flow towards the liver. Other: Trace perihepatic ascites is noted. IMPRESSION: Cholelithiasis  without sonographic evidence of acute cholecystitis. Moderate to severe hepatic steatosis.  Mild hepatomegaly. Mild perihepatic ascites. Electronically Signed   By: Helyn Numbers MD   On: 05/24/2020 07:28   Review of Systems  Constitutional: Positive for activity change, fatigue and unexpected weight change. Negative for appetite change, chills, diaphoresis and fever.  HENT: Positive for nosebleeds.   Eyes: Negative.   Respiratory: Positive for chest tightness, shortness of breath and wheezing. Negative for apnea, cough, choking and stridor.   Cardiovascular: Positive for chest pain, palpitations and leg swelling.  Gastrointestinal: Positive for abdominal distention and abdominal pain. Negative for anal bleeding, blood in stool, constipation, diarrhea, nausea, rectal pain and vomiting.  Musculoskeletal: Positive for arthralgias.  Allergic/Immunologic: Negative for environmental allergies, food allergies and immunocompromised state.  Neurological: Positive for dizziness, weakness and light-headedness. Negative for tremors, seizures, syncope, facial asymmetry, speech difficulty, numbness and headaches.  Hematological: Negative.   Psychiatric/Behavioral: Positive for agitation, behavioral problems, confusion and dysphoric mood. Negative for hallucinations, self-injury and sleep disturbance. The patient is nervous/anxious. The patient is not hyperactive.    Blood pressure 129/76, pulse (!) 103, temperature 98.6 F (37 C), resp. rate 18, height 6' (1.829 m), weight 92 kg, SpO2 96 %. Physical Exam Constitutional:      General: He is in acute distress.     Appearance: He is obese. He is ill-appearing. He is not diaphoretic.  HENT:     Head: Normocephalic and atraumatic.  Eyes:     Extraocular Movements: Extraocular movements intact.     Pupils: Pupils are equal, round, and reactive to light.  Cardiovascular:     Rate and Rhythm: Tachycardia present.  Pulmonary:     Effort: Tachypnea and  accessory muscle usage present.     Breath sounds: Wheezing present. No rales.  Chest:     Chest wall: No mass, deformity, tenderness, crepitus or edema.  Abdominal:     Palpations: Abdomen is soft.  Musculoskeletal:     Cervical back: Neck supple.  Skin:    General: Skin is warm and dry.  Neurological:     Mental Status: He is disoriented.  Psychiatric:        Mood and Affect: Mood is anxious.        Behavior: Behavior is agitated.   Assessment/Plan: 1) Symptomatic anemia with a hemoglobin of 5.9 g/dL-in all probability due to the multiple nosebleeds the patient has had.  The epistaxis being  followed by Dr. Ina Homes surgeon.  Patient received 3 units of packed red blood cells; patient does not seem stable enough for any invasive endoscopic procedures at this time till his pulmonary status improves. 2) Alcohol abuse with acute withdrawal-patient receiving Ativan; ascites noted on abdominal ultrasound with jaundice on exam-total bili 7.4 3) Severe hyponatremia due to heavy alcohol abuse. 4) Cholelithiasis with fatty liver and hepatomegaly noted on right upper quadrant ultrasound with perihepatic ascites. 5) Bilateral pneumonia-on ceftriaxone.  Patient seems acutely short of breath at the time of my examination therefore as per my discussion with his nurse his attending physician was called who ordered breathing treatments for him.  6) Lactic acidosis which seems to have resolved. 7) Hypertension.  Charna Elizabeth 05/24/2020, 1:17 PM

## 2020-05-25 LAB — TYPE AND SCREEN
ABO/RH(D): A POS
Antibody Screen: NEGATIVE
Unit division: 0
Unit division: 0
Unit division: 0

## 2020-05-25 LAB — BPAM RBC
Blood Product Expiration Date: 202204132359
Blood Product Expiration Date: 202204222359
Blood Product Expiration Date: 202204222359
ISSUE DATE / TIME: 202203272026
ISSUE DATE / TIME: 202203280111
ISSUE DATE / TIME: 202203281210
Unit Type and Rh: 6200
Unit Type and Rh: 6200
Unit Type and Rh: 6200

## 2020-05-25 LAB — CBC
HCT: 21 % — ABNORMAL LOW (ref 39.0–52.0)
HCT: 22 % — ABNORMAL LOW (ref 39.0–52.0)
Hemoglobin: 7 g/dL — ABNORMAL LOW (ref 13.0–17.0)
Hemoglobin: 7 g/dL — ABNORMAL LOW (ref 13.0–17.0)
MCH: 28.8 pg (ref 26.0–34.0)
MCH: 28 pg (ref 26.0–34.0)
MCHC: 33.3 g/dL (ref 30.0–36.0)
MCHC: 31.8 g/dL (ref 30.0–36.0)
MCV: 86.4 fL (ref 80.0–100.0)
MCV: 88 fL (ref 80.0–100.0)
Platelets: 210 10*3/uL (ref 150–400)
Platelets: 212 10*3/uL (ref 150–400)
RBC: 2.43 MIL/uL — ABNORMAL LOW (ref 4.22–5.81)
RBC: 2.5 MIL/uL — ABNORMAL LOW (ref 4.22–5.81)
RDW: 19.9 % — ABNORMAL HIGH (ref 11.5–15.5)
RDW: 19.9 % — ABNORMAL HIGH (ref 11.5–15.5)
WBC: 17.7 10*3/uL — ABNORMAL HIGH (ref 4.0–10.5)
WBC: 19 10*3/uL — ABNORMAL HIGH (ref 4.0–10.5)
nRBC: 0.1 % (ref 0.0–0.2)
nRBC: 0 % (ref 0.0–0.2)

## 2020-05-25 LAB — COMPREHENSIVE METABOLIC PANEL
ALT: 15 U/L (ref 0–44)
AST: 114 U/L — ABNORMAL HIGH (ref 15–41)
Albumin: 2.1 g/dL — ABNORMAL LOW (ref 3.5–5.0)
Alkaline Phosphatase: 145 U/L — ABNORMAL HIGH (ref 38–126)
Anion gap: 9 (ref 5–15)
BUN: 5 mg/dL — ABNORMAL LOW (ref 6–20)
CO2: 25 mmol/L (ref 22–32)
Calcium: 7.8 mg/dL — ABNORMAL LOW (ref 8.9–10.3)
Chloride: 89 mmol/L — ABNORMAL LOW (ref 98–111)
Creatinine, Ser: 0.64 mg/dL (ref 0.61–1.24)
GFR, Estimated: >60 mL/min (ref >60)
Glucose, Bld: 92 mg/dL (ref 70–99)
Potassium: 3.7 mmol/L (ref 3.5–5.1)
Sodium: 123 mmol/L — ABNORMAL LOW (ref 135–145)
Total Bilirubin: 8.1 mg/dL — ABNORMAL HIGH (ref 0.3–1.2)
Total Protein: 7.1 g/dL (ref 6.5–8.1)

## 2020-05-25 LAB — BASIC METABOLIC PANEL
Anion gap: 8 (ref 5–15)
BUN: 6 mg/dL (ref 6–20)
CO2: 26 mmol/L (ref 22–32)
Calcium: 7.8 mg/dL — ABNORMAL LOW (ref 8.9–10.3)
Chloride: 89 mmol/L — ABNORMAL LOW (ref 98–111)
Creatinine, Ser: 0.67 mg/dL (ref 0.61–1.24)
GFR, Estimated: >60 mL/min (ref >60)
Glucose, Bld: 105 mg/dL — ABNORMAL HIGH (ref 70–99)
Potassium: 3.4 mmol/L — ABNORMAL LOW (ref 3.5–5.1)
Sodium: 123 mmol/L — ABNORMAL LOW (ref 135–145)

## 2020-05-25 MED ORDER — PEG 3350-KCL-NA BICARB-NACL 420 G PO SOLR
4000.0000 mL | Freq: Once | ORAL | Status: AC
  Administered 2020-05-25: 4000 mL via ORAL
  Filled 2020-05-25: qty 4000

## 2020-05-25 MED ORDER — LACTULOSE 10 GM/15ML PO SOLN
10.0000 g | Freq: Every day | ORAL | Status: DC
  Administered 2020-05-25 – 2020-05-28 (×4): 10 g via ORAL
  Filled 2020-05-25 (×4): qty 15

## 2020-05-25 MED ORDER — FUROSEMIDE 10 MG/ML IJ SOLN
60.0000 mg | Freq: Two times a day (BID) | INTRAMUSCULAR | Status: DC
  Administered 2020-05-25 – 2020-05-28 (×7): 60 mg via INTRAVENOUS
  Filled 2020-05-25 (×9): qty 6

## 2020-05-25 NOTE — Progress Notes (Signed)
Subjective: Feeling better.  His SOB has improved.  Objective: Vital signs in last 24 hours: Temp:  [98.2 F (36.8 C)-99.1 F (37.3 C)] 98.2 F (36.8 C) (03/29 0446) Pulse Rate:  [99-104] 99 (03/29 0446) Resp:  [16-20] 18 (03/29 0446) BP: (115-129)/(71-80) 115/78 (03/29 0446) SpO2:  [91 %-99 %] 93 % (03/29 0446) Last BM Date: 05/21/20  Intake/Output from previous day: 03/28 0701 - 03/29 0700 In: 55.2 [I.V.:55.2] Out: 1000 [Urine:1000] Intake/Output this shift: Total I/O In: 60 [P.O.:60] Out: 300 [Urine:300]  General appearance: alert and no distress Resp: clear to auscultation bilaterally Cardio: regular rate and rhythm GI: soft, non-tender; bowel sounds normal; no masses,  no organomegaly Extremities: extremities normal, atraumatic, no cyanosis or edema  Lab Results: Recent Labs    05/23/20 1501 05/24/20 0516 05/24/20 1835 05/25/20 0252  WBC 19.6* 19.4*  --  17.7*  HGB 5.9* 6.8* 7.1* 7.0*  HCT 19.2* 20.8* 20.9* 21.0*  PLT 253 251  --  210   BMET Recent Labs    05/24/20 1835 05/24/20 2208 05/25/20 0252  NA 122* 123* 123*  K 3.7 3.6 3.7  CL 88* 88* 89*  CO2 25 26 25   GLUCOSE 91 98 92  BUN 6 6 5*  CREATININE 0.65 0.70 0.64  CALCIUM 7.9* 7.8* 7.8*   LFT Recent Labs    05/23/20 2203 05/24/20 0743 05/25/20 0252  PROT 7.2   < > 7.1  ALBUMIN 2.2*   < > 2.1*  AST 132*   < > 114*  ALT 17   < > 15  ALKPHOS 156*   < > 145*  BILITOT 6.7*   < > 8.1*  BILIDIR 3.5*  --   --   IBILI 3.2*  --   --    < > = values in this interval not displayed.   PT/INR Recent Labs    05/23/20 2245 05/24/20 0743  LABPROT 17.4* 17.9*  INR 1.5* 1.5*   Hepatitis Panel No results for input(s): HEPBSAG, HCVAB, HEPAIGM, HEPBIGM in the last 72 hours. C-Diff No results for input(s): CDIFFTOX in the last 72 hours. Fecal Lactopherrin No results for input(s): FECLLACTOFRN in the last 72 hours.  Studies/Results: DG Chest 2 View  Result Date: 05/23/2020 CLINICAL DATA:   Shortness of breath EXAM: CHEST - 2 VIEW COMPARISON:  None. FINDINGS: Cardiomegaly. There is a rounded, masslike opacity of the central right upper lobe which measures approximately 4.2 cm in projection. There is superimposed heterogeneous and interstitial airspace opacity, particularly of the perihilar left lung. Small left, trace right pleural effusions. The visualized skeletal structures are unremarkable. IMPRESSION: 1. There is a rounded, masslike opacity of the central right upper lobe which measures approximately 4.2 cm in projection. This is concerning for malignancy, although may reflect infectious airspace disease. Consider CT to further evaluate. Absolute minimum recommend radiographic follow-up at 6-8 weeks to ensure complete resolution. 2. There is superimposed heterogeneous and interstitial airspace opacity, particularly of the perihilar left lung. Small left, trace right pleural effusions. Findings suggest edema or infection. 3. Cardiomegaly. Electronically Signed   By: 05/25/2020 M.D.   On: 05/23/2020 15:19   CT Angio Chest PE W/Cm &/Or Wo Cm  Result Date: 05/23/2020 CLINICAL DATA:  PE suspected, low/intermediate prob, positive D-dimer Shortness of breath, possible PE, lung mass EXAM: CT ANGIOGRAPHY CHEST WITH CONTRAST TECHNIQUE: Multidetector CT imaging of the chest was performed using the standard protocol during bolus administration of intravenous contrast. Multiplanar CT image reconstructions and MIPs were obtained  to evaluate the vascular anatomy. CONTRAST:  3mL OMNIPAQUE IOHEXOL 350 MG/ML SOLN COMPARISON:  Radiograph earlier today. FINDINGS: Cardiovascular: Evaluation for pulmonary embolus is diagnostic to the lobar levels in the mid and upper lung zones and segmental levels at the bases. No evidence of pulmonary embolus. Normal caliber thoracic aorta without acute aortic abnormality. Upper normal heart size. No pericardial effusion. Suggestion of scattered coronary artery calcifications,  not well assessed due to motion on the current exam. Mediastinum/Nodes: Small mediastinal lymph nodes not enlarged by size criteria. No enlarged hilar lymph nodes. No axillary adenopathy. Patulous esophagus. No esophageal wall thickening. No suspicious thyroid nodule. Lungs/Pleura: There is no pulmonary mass. The masslike opacity on radiograph corresponds to a rounded area of ground-glass and consolidative opacity in the right upper lobe. Similar ground-glass and consolidative opacities within the apical and posterior segments of the right upper lobe, anterior left upper lobe, lingula, and to a lesser extent right middle and lower lobe. Some of these ground-glass opacities appear slightly nodular. Small to moderate left pleural effusion with adjacent compressive atelectasis. Trace right pleural effusion with adjacent atelectasis. Trace fluid in the fissures. There is minimal basilar septal thickening. Upper Abdomen: Diffuse hepatic steatosis. Included liver appears enlarged. No acute upper abdominal findings. Musculoskeletal: There are no acute or suspicious osseous abnormalities. Review of the MIP images confirms the above findings. IMPRESSION: 1. No central pulmonary embolus. 2. Multifocal ground-glass and consolidative opacities throughout both lungs. Differential considerations include pulmonary edema or atypical pneumonia. 3. No pulmonary mass. Radiographic appearance in the right lung correlates to a rounded area of ground-glass and consolidative opacity in the right upper lobe, as described above. 4. Small to moderate left and trace right pleural effusions with adjacent compressive atelectasis. 5. Suspect coronary artery calcifications. 6. Incidental note of hepatic steatosis and probable hepatomegaly in the upper abdomen. Electronically Signed   By: Narda Rutherford M.D.   On: 05/23/2020 18:43   ECHOCARDIOGRAM COMPLETE  Result Date: 05/24/2020    ECHOCARDIOGRAM REPORT   Patient Name:   Edward Valdez Date  of Exam: 05/24/2020 Medical Rec #:  [id number]    Height:       72.0 in Accession #:    [id number]   Weight:       202.8 lb Date of Birth:  11-14-1975    BSA:          2.143 m Patient Age:    45 years     BP:           131/80 mmHg Patient Gender: M            HR:           99 bpm. Exam Location:  Inpatient Procedure: 2D Echo, Cardiac Doppler and Color Doppler Indications:    CHF  History:        Patient has no prior history of Echocardiogram examinations.                 CHF, Signs/Symptoms:Shortness of Breath; Risk                 Factors:Hypertension.  Sonographer:    Neomia Dear RDCS Referring Phys: [id number] [facility]o [facility]  Sonographer Comments: Suboptimal subcostal window. IMPRESSIONS  1. Left ventricular ejection fraction, by estimation, is 65 to 70%. The left ventricle has hyperdynamic function. The left ventricle has no regional wall motion abnormalities. There is mild left ventricular hypertrophy. Left ventricular diastolic parameters were normal.  2. Right ventricular systolic function is  normal. The right ventricular size is normal. There is normal pulmonary artery systolic pressure. The estimated right ventricular systolic pressure is 29.8 mmHg.  3. Left atrial size was mildly dilated.  4. The aortic valve is tricuspid. Mild aortic valve sclerosis is present. Mean aortic valve gradient 17 mmHg, but calculated AVA > 2 cm^2. The valve appears to open well, doubt clinically significant stenosis (possibly elevated gradient due to high flow).  5. The mitral valve is normal in structure. No evidence of mitral valve regurgitation. No evidence of mitral stenosis.  6. Aortic dilatation noted. There is mild dilatation of the aortic root, measuring 37 mm.  7. The inferior vena cava is normal in size with greater than 50% respiratory variability, suggesting right atrial pressure of 3 mmHg. FINDINGS  Left Ventricle: Left ventricular ejection fraction, by estimation, is 65 to 70%. The left ventricle has hyperdynamic  function. The left ventricle has no regional wall motion abnormalities. The left ventricular internal cavity size was normal in size. There is mild left ventricular hypertrophy. Left ventricular diastolic parameters were normal. Right Ventricle: The right ventricular size is normal. No increase in right ventricular wall thickness. Right ventricular systolic function is normal. There is normal pulmonary artery systolic pressure. The tricuspid regurgitant velocity is 2.59 m/s, and  with an assumed right atrial pressure of 3 mmHg, the estimated right ventricular systolic pressure is 29.8 mmHg. Left Atrium: Left atrial size was mildly dilated. Right Atrium: Right atrial size was normal in size. Pericardium: Trivial pericardial effusion is present. Mitral Valve: The mitral valve is normal in structure. No evidence of mitral valve regurgitation. No evidence of mitral valve stenosis. Tricuspid Valve: The tricuspid valve is normal in structure. Tricuspid valve regurgitation is trivial. Aortic Valve: The aortic valve is tricuspid. Aortic valve regurgitation is not visualized. Mild aortic valve sclerosis is present, with no evidence of aortic valve stenosis. Aortic valve mean gradient measures 15.0 mmHg. Aortic valve peak gradient measures 26.2 mmHg. Aortic valve area, by VTI measures 2.51 cm. Pulmonic Valve: The pulmonic valve was normal in structure. Pulmonic valve regurgitation is not visualized. Aorta: Aortic dilatation noted. There is mild dilatation of the aortic root, measuring 37 mm. Venous: The inferior vena cava is normal in size with greater than 50% respiratory variability, suggesting right atrial pressure of 3 mmHg. IAS/Shunts: No atrial level shunt detected by color flow Doppler.  LEFT VENTRICLE PLAX 2D LVIDd:         5.10 cm      Diastology LVIDs:         2.60 cm      LV e' medial:    12.40 cm/s LV PW:         1.20 cm      LV E/e' medial:  11.5 LV IVS:        1.20 cm      LV e' lateral:   9.68 cm/s LVOT diam:      2.00 cm      LV E/e' lateral: 14.8 LV SV:         113 LV SV Index:   53 LVOT Area:     3.14 cm  LV Volumes (MOD) LV vol d, MOD A2C: 108.0 ml LV vol d, MOD A4C: 139.0 ml LV vol s, MOD A2C: 33.4 ml LV vol s, MOD A4C: 28.0 ml LV SV MOD A2C:     74.6 ml LV SV MOD A4C:     139.0 ml LV SV MOD BP:        93.5 ml  PULMONARY VEINS A Reversal Duration: 111.00 msec A Reversal Velocity: 43.90 cm/s Diastolic Velocity:  113.00 cm/s S/D Velocity:        0.70 Systolic Velocity:   82.70 cm/s LEFT ATRIUM             Index       RIGHT ATRIUM           Index LA diam:        3.30 cm 1.54 cm/m  RA Area:     12.20 cm LA Vol (A2C):   90.0 ml 41.99 ml/m RA Volume:   24.50 ml  11.43 ml/m LA Vol (A4C):   56.7 ml 26.45 ml/m LA Biplane Vol: 76.0 ml 35.46 ml/m  AORTIC VALVE                    PULMONIC VALVE AV Area (Vmax):    2.49 cm     PV Vmax:       1.23 m/s AV Area (Vmean):   2.33 cm     PV Vmean:      86.700 cm/s AV Area (VTI):     2.51 cm     PV VTI:        0.211 m AV Vmax:           256.00 cm/s  PV Peak grad:  6.1 mmHg AV Vmean:          180.000 cm/s PV Mean grad:  4.0 mmHg AV VTI:            0.450 m AV Peak Grad:      26.2 mmHg AV Mean Grad:      15.0 mmHg LVOT Vmax:         202.50 cm/s LVOT Vmean:        133.500 cm/s LVOT VTI:          0.360 m LVOT/AV VTI ratio: 0.80  AORTA Ao Root diam: 3.70 cm Ao Asc diam:  3.30 cm MITRAL VALVE                TRICUSPID VALVE MV Area (PHT): 3.16 cm     TR Peak grad:   26.8 mmHg MV Decel Time: 240 msec     TR Vmax:        259.00 cm/s MV E velocity: 143.00 cm/s MV A velocity: 130.00 cm/s  SHUNTS MV E/A ratio:  1.10         Systemic VTI:  0.36 m                             Systemic Diam: 2.00 cm Marca Ancona MD Electronically signed by Marca Ancona MD Signature Date/Time: 05/24/2020/1:42:04 PM    Final    [place] ASCITES (ABDOMEN LIMITED)  Result Date: 05/24/2020 CLINICAL DATA:  Ascites check EXAM: LIMITED ABDOMEN ULTRASOUND FOR ASCITES TECHNIQUE: Limited ultrasound survey for ascites was  performed in all four abdominal quadrants. COMPARISON:  None. FINDINGS: Small volume ascites predominantly located within the right upper quadrant along the liver without pocket measuring approximately 6.4 cm in diameter. IMPRESSION: Small volume ascites predominantly located within the right upper quadrant. Electronically Signed   By: Maudry Mayhew MD   On: 05/24/2020 00:14   US Abdomen Limited RUQ (LIVER/GB)  Result Date: 05/24/2020 CLINICAL DATA:  Cirrhosis, ascites EXAM: ULTRASOUND ABDOMEN LIMITED RIGHT UPPER QUADRANT COMPARISON:  None. FINDINGS: Gallbladder: A small amount of layering sludge is seen  within the gallbladder lumen. The gallbladder, however, is not distended and there is no gallbladder wall thickening identified. There is trace pericholecystic fluid, nonspecific in the setting of ascites. The sonographic Eulah PontMurphy sign is reportedly negative. Common bile duct: Diameter: 3 mm in proximal diameter Liver: The hepatic parenchyma is diffusely markedly echogenic and there is poor acoustic through transmission and diffuse coarsening of the hepatic echotexture. Altogether, the findings are in keeping with moderate to severe hepatic steatosis. Liver size is mildly enlarged. No definite focal intrahepatic masses are identified though parenchymal changes decreased sensitivity for detection of such masses. There is no intrahepatic biliary ductal dilation identified. Portal vein is patent on color Doppler imaging with normal direction of blood flow towards the liver. Other: Trace perihepatic ascites is noted. IMPRESSION: Cholelithiasis without sonographic evidence of acute cholecystitis. Moderate to severe hepatic steatosis.  Mild hepatomegaly. Mild perihepatic ascites. Electronically Signed   By: Helyn NumbersAshesh  Parikh MD   On: 05/24/2020 07:28    Medications:  Scheduled: . amLODipine  10 mg Oral Daily  . dextroamphetamine  20 mg Oral BID  . folic acid  1 mg Oral Daily  . furosemide  40 mg Intravenous BID   . LORazepam  0-4 mg Oral Q6H   Followed by  . [START ON 05/26/2020] LORazepam  0-4 mg Oral Q12H  . multivitamin with minerals  1 tablet Oral Daily  . pantoprazole (PROTONIX) IV  40 mg Intravenous Q12H  . polyethylene glycol-electrolytes  4,000 mL Oral Once  . spironolactone  25 mg Oral Daily  . thiamine  100 mg Oral Daily   Or  . thiamine  100 mg Intravenous Daily   Continuous: . azithromycin 500 mg (05/24/20 2203)  . cefTRIAXone (ROCEPHIN)  IV 2 g (05/24/20 2154)    Assessment/Plan: 1) Anemia. 2) Heme positive stool. 3) ETOH abuse. 4) 4.2 cm lesion in the RUL of the lung.   The patient reports feel much better.  Minor movements no longer cause him to be short of breath.  His HGB is much better after the transfusions.  Plan: 1) EGD/colonoscopy with Dr. Loreta AveMann tomorrow.  LOS: 1 day   Jacob Chamblee D 05/25/2020, 10:33 AM

## 2020-05-25 NOTE — Progress Notes (Signed)
Progress Note    Edward Valdez  GQQ:[id number] DOB: 08/19/1975  DOA: 05/23/2020 PCP: [facility]    Brief Narrative:    Medical records reviewed and are as summarized below:  Edward Valdez is an 45 y.o. male with past medical history of alcohol abuse, hypertension who presents to [facility] emergency department with dyspnea on exertion.  Patient explains that for the past 2 weeks he has been experiencing shortness of breath.  The shortness of breath was initially mild in intensity but as the days progressed shortness of breath became more and more severe.  Shortness of breath is worse with exertion and improved with rest.  Over the span of time patient is complaining of associated bilateral lower extremity edema, increasing abdominal girth, generalized malaise and weakness as well as episodes of paroxysmal nocturnal dyspnea and progressive yellowing of the skin.  Assessment/Plan:   Principal Problem:   Shortness of breath Active Problems:   Essential hypertension   Symptomatic anemia   Lactic acidosis   Hyponatremia   Pneumonia of both lungs due to infectious organism   Elevated troponin level not due myocardial infarction   Alcoholic liver disease (HCC)   Alcohol dependence with uncomplicated withdrawal (HCC)   GI bleed   Shortness of breath -Patient presenting with several week history of progressively worsening shortness of breath dyspnea on exertion and paroxysmal nocturnal dyspnea -Symptoms are multifactorial in origin, secondary to symptomatic anemia with hemoglobin of 5.9.  concurrent bilateral pneumonia with superimposed pulmonary edema. -s/p 3 units of PRBCs-- HGb at 7 -IV lasix and add aldactone -IV abx -Supplemental oxygen as needed for ox saturations of less than 92%. -Echocardiogram: Left ventricular ejection fraction, by estimation, is 65 to 70%. The  left ventricle has hyperdynamic function. The left ventricle has no  regional  wall motion abnormalities. There is mild left ventricular  hypertrophy. Left ventricular diastolic  parameters were normal.    Symptomatic anemia -Multifactorial symptomatic anemia with presenting hemoglobin of 5.9 -Patient's shortness of breath and malaise are likely at least in part due to patient's profound anemia -No prior blood work in our system to compare with a baseline -Anemia likely secondary to myelosuppression due to extremely heavy alcohol abuse in addition to concerns for upper gastrointestinal bleeding. - intravenous PPI every 12 hours -GI consult: plan for EGD in AM along with colonoscopy    Lactic acidosis -resolved    Pneumonia -IV Abx -Blood cultures NGTD    Essential hypertension -Continue home antihypertensive therapy as blood pressure tolerates    Hyponatremia -Patient is exhibiting substantial hyponatremia, likely secondary to "beer [place]oto mania" from heavy alcohol use possibly exacerbated by some degree of volume overload -Diurese and monitor -q 12 checks  Alcohol dependence with uncomplicated withdrawal -Patient reports a several year history of extremely heavy alcohol use drinking half of a fifth of bourbon daily for a minimum of 8 shots -CIWA: scheduled and PRN    Alcoholic liver disease (HCC) -Patient exhibits evidence of alcoholic liver disease on exam and history with longstanding history of heavy drinking, evidence of ascites and jaundice on exam -Madreys discriminant function calculates to 16.8 suggesting that steroids are not necessary. -Managing withdrawal of alcohol as mentioned above -Patient has been advised to cease drinking alcohol immediately  Elevated d dimer -CTA negative   Needs close monitoring and perhaps a transfer to a higher level of care such as progressive   Family Communication/Anticipated D/C date and plan/Code Status  DVT prophylaxis: scd Code Status: Full Code.  Disposition Plan: Status is: Inpatient Family  at bedside Remains inpatient appropriate because:Inpatient level of care appropriate due to severity of illness   Dispo: The patient is from: Home              Anticipated d/c is to: Home              Patient currently is not medically stable to d/c.           Medical Consultants:    GI [address])     Subjective:   Excited to be getting some food  Objective:    Vitals:   05/24/20 1836 05/24/20 2354 05/25/20 0446 05/25/20 1206  BP: 118/73 129/75 115/78 118/79  Pulse: (!) 102 100 99 (!) 101  Resp: Temp: 99.1 F (37.3 C) 98.8 F (37.1 C) 98.2 F (36.8 C) 98.5 F (36.9 C)  TempSrc: Oral Oral Oral Oral  SpO2: 96% 91% 93% 97%  Weight:      Height:        Intake/Output Summary (Last 24 hours) at 05/25/2020 1252 Last data filed at 05/25/2020 1039 Gross per 24 hour  Intake 85.17 ml  Output 1625 ml  Net -1539.83 ml   Filed Weights   05/23/20 2208  Weight: 92 kg    Exam:  General: Appearance:     Overweight male who appears to be withdrawing, jaundinced     Lungs:     Mild increased work of breathing  Heart:    Tachycardic.   MS:   All extremities are intact. Improved LE edema  Neurologic: Awake, alert, tremulous     Data Reviewed:   I have personally reviewed following labs and imaging studies:  Labs: Labs show the following:   Basic Metabolic Panel: Recent Labs  Lab 05/23/20 2203 05/23/20 2245 05/24/20 0516 05/24/20 0743 05/24/20 1835 05/24/20 2208 05/25/20 0252  NA  --    < > 121* 120* 122* 123* 123*  K  --    < > 3.4* 3.4* 3.7 3.6 3.7  CL  --    < > 87* 86* 88* 88* 89*  CO2  --    < > GLUCOSE  --    < > 109* 98 91 98 92  BUN  --    < > 5* 5*  CREATININE  --    < > 0.68 0.62 0.65 0.70 0.64  CALCIUM  --    < > 8.1* 8.1* 7.9* 7.8* 7.8*  MG 1.9  --   --  1.9  --   --   --    < > = values in this interval not displayed.   GFR Estimated Creatinine Clearance: 125.3 mL/min (by C-G formula based on SCr of  0.64 mg/dL). Liver Function Tests: Recent Labs  Lab 05/23/20 2203 05/24/20 0743 05/25/20 0252  AST 132* 130* 114*  ALT ALKPHOS 156* 165* 145*  BILITOT 6.7* 7.4* 8.1*  PROT 7.2 7.5 7.1  ALBUMIN 2.2* 2.3* 2.1*   No results for input(s): LIPASE, AMYLASE in the last 168 hours. Recent Labs  Lab 05/23/20 2203  AMMONIA 51*   Coagulation profile Recent Labs  Lab 05/23/20 2245 05/24/20 0743  INR 1.5* 1.5*    CBC: Recent Labs  Lab 05/23/20 1501 05/24/20 0516 05/24/20 1835 05/25/20 0252  WBC 19.6* 19.4*  --  17.7*  HGB 5.9*  6.8* 7.1* 7.0*  HCT 19.2* 20.8* 20.9* 21.0*  MCV 87.7 86.3  --  86.4  PLT 253 251  --  210   Cardiac Enzymes: No results for input(s): CKTOTAL, CKMB, CKMBINDEX, TROPONINI in the last 168 hours. BNP (last 3 results) No results for input(s): PROBNP in the last 8760 hours. CBG: No results for input(s): GLUCAP in the last 168 hours. D-Dimer: Recent Labs    05/23/20 1627  DDIMER 5.47*   Hgb A1c: No results for input(s): HGBA1C in the last 72 hours. Lipid Profile: No results for input(s): CHOL, HDL, LDLCALC, TRIG, CHOLHDL, LDLDIRECT in the last 72 hours. Thyroid function studies: Recent Labs    05/23/20 07-07-04  TSH 3.911   Anemia work up: Recent Labs    05/23/20 2201-07-07  VITAMINB12 1,427*  FOLATE 2.2*  TIBC 237*  IRON 23*   Sepsis Labs: Recent Labs  Lab 05/23/20 1501 05/23/20 2201-07-07 05/24/20 0516 05/25/20 0252  PROCALCITON  --  0.58  --   --   WBC 19.6*  --  19.4* 17.7*  LATICACIDVEN  --  2.5* 1.4  --     Microbiology Recent Results (from the past 240 hour(s))  Resp Panel by RT-PCR (Flu A&B, Covid) Nasopharyngeal Swab     Status: None   Collection Time: 05/23/20  5:13 PM   Specimen: Nasopharyngeal Swab; Nasopharyngeal(NP) swabs in vial transport medium  Result Value Ref Range Status   SARS Coronavirus 2 by RT PCR NEGATIVE NEGATIVE Final    Comment: (NOTE) SARS-CoV-2 target nucleic acids are NOT DETECTED.  The  SARS-CoV-2 RNA is generally detectable in upper respiratory specimens during the acute phase of infection. The lowest concentration of SARS-CoV-2 viral copies this assay can detect is 138 copies/mL. A negative result does not preclude SARS-Cov-2 infection and should not be used as the sole basis for treatment or other patient management decisions. A negative result may occur with  improper specimen collection/handling, submission of specimen other than nasopharyngeal swab, presence of viral mutation(s) within the areas targeted by this assay, and inadequate number of viral copies(<138 copies/mL). A negative result must be combined with clinical observations, patient history, and epidemiological information. The expected result is Negative.  Fact Sheet for Patients:  BloggerCourse.com  Fact Sheet for Healthcare Providers:  SeriousBroker.it  This test is no t yet approved or cleared by the [place] FDA and  has been authorized for detection and/or diagnosis of SARS-CoV-2 by FDA under an Emergency Use Authorization (EUA). This EUA will remain  in effect (meaning this test can be used) for the duration of the COVID-19 declaration under Section 564(b)(1) of the Act, 21 U.S.C.section 360bbb-3(b)(1), unless the authorization is terminated  or revoked sooner.       Influenza A by PCR NEGATIVE NEGATIVE Final   Influenza B by PCR NEGATIVE NEGATIVE Final    Comment: (NOTE) The Xpert Xpress SARS-CoV-2/FLU/RSV plus assay is intended as an aid in the diagnosis of influenza from Nasopharyngeal swab specimens and should not be used as a sole basis for treatment. Nasal washings and aspirates are unacceptable for Xpert Xpress SARS-CoV-2/FLU/RSV testing.  Fact Sheet for Patients: BloggerCourse.com  Fact Sheet for Healthcare Providers: SeriousBroker.it  This test is not yet approved or  cleared by the [place] FDA and has been authorized for detection and/or diagnosis of SARS-CoV-2 by FDA under an Emergency Use Authorization (EUA). This EUA will remain in effect (meaning this test can be used) for the duration of the COVID-19 declaration under Section  564(b)(1) of the Act, 21 U.S.C. section 360bbb-3(b)(1), unless the authorization is terminated or revoked.  Performed at [facility], [address], [place], [place] [address]   Culture, blood (routine x 2)     Status: None (Preliminary result)   Collection Time: 05/24/20  5:16 AM   Specimen: BLOOD LEFT ARM  Result Value Ref Range Status   Specimen Description BLOOD LEFT ARM  Final   Special Requests   Final    BOTTLES DRAWN AEROBIC ONLY Blood Culture results may not be optimal due to an inadequate volume of blood received in culture bottles   Culture   Final    NO GROWTH 1 DAY Performed at [facility], [address] [place], [place] [address]    Report Status PENDING  Incomplete  Culture, blood (routine x 2)     Status: None (Preliminary result)   Collection Time: 05/24/20  5:16 AM   Specimen: BLOOD RIGHT ARM  Result Value Ref Range Status   Specimen Description BLOOD RIGHT ARM  Final   Special Requests   Final    BOTTLES DRAWN AEROBIC ONLY Blood Culture results may not be optimal due to an inadequate volume of blood received in culture bottles   Culture   Final    NO GROWTH 1 DAY Performed at [facility], [address], [place], [place] [address]    Report Status PENDING  Incomplete    Procedures and diagnostic studies:  DG Chest 2 View  Result Date: 05/23/2020 CLINICAL DATA:  Shortness of breath EXAM: CHEST - 2 VIEW COMPARISON:  None. FINDINGS: Cardiomegaly. There is a rounded, masslike opacity of the central right upper lobe which measures approximately 4.2 cm in projection. There is superimposed heterogeneous and interstitial airspace opacity, particularly of the perihilar  left lung. Small left, trace right pleural effusions. The visualized skeletal structures are unremarkable. IMPRESSION: 1. There is a rounded, masslike opacity of the central right upper lobe which measures approximately 4.2 cm in projection. This is concerning for malignancy, although may reflect infectious airspace disease. Consider CT to further evaluate. Absolute minimum recommend radiographic follow-up at 6-8 weeks to ensure complete resolution. 2. There is superimposed heterogeneous and interstitial airspace opacity, particularly of the perihilar left lung. Small left, trace right pleural effusions. Findings suggest edema or infection. 3. Cardiomegaly. Electronically Signed   By: Lauralyn Primes M.D.   On: 05/23/2020 15:19   CT Angio Chest PE W/Cm &/Or Wo Cm  Result Date: 05/23/2020 CLINICAL DATA:  PE suspected, low/intermediate prob, positive D-dimer Shortness of breath, possible PE, lung mass EXAM: CT ANGIOGRAPHY CHEST WITH CONTRAST TECHNIQUE: Multidetector CT imaging of the chest was performed using the standard protocol during bolus administration of intravenous contrast. Multiplanar CT image reconstructions and MIPs were obtained to evaluate the vascular anatomy. CONTRAST:  51mL OMNIPAQUE IOHEXOL 350 MG/ML SOLN COMPARISON:  Radiograph earlier today. FINDINGS: Cardiovascular: Evaluation for pulmonary embolus is diagnostic to the lobar levels in the mid and upper lung zones and segmental levels at the bases. No evidence of pulmonary embolus. Normal caliber thoracic aorta without acute aortic abnormality. Upper normal heart size. No pericardial effusion. Suggestion of scattered coronary artery calcifications, not well assessed due to motion on the current exam. Mediastinum/Nodes: Small mediastinal lymph nodes not enlarged by size criteria. No enlarged hilar lymph nodes. No axillary adenopathy. Patulous esophagus. No esophageal wall thickening. No suspicious thyroid nodule. Lungs/Pleura: There is no pulmonary  mass. The masslike opacity on radiograph corresponds  to a rounded area of ground-glass and consolidative opacity in the right upper lobe. Similar ground-glass and consolidative opacities within the apical and posterior segments of the right upper lobe, anterior left upper lobe, lingula, and to a lesser extent right middle and lower lobe. Some of these ground-glass opacities appear slightly nodular. Small to moderate left pleural effusion with adjacent compressive atelectasis. Trace right pleural effusion with adjacent atelectasis. Trace fluid in the fissures. There is minimal basilar septal thickening. Upper Abdomen: Diffuse hepatic steatosis. Included liver appears enlarged. No acute upper abdominal findings. Musculoskeletal: There are no acute or suspicious osseous abnormalities. Review of the MIP images confirms the above findings. IMPRESSION: 1. No central pulmonary embolus. 2. Multifocal ground-glass and consolidative opacities throughout both lungs. Differential considerations include pulmonary edema or atypical pneumonia. 3. No pulmonary mass. Radiographic appearance in the right lung correlates to a rounded area of ground-glass and consolidative opacity in the right upper lobe, as described above. 4. Small to moderate left and trace right pleural effusions with adjacent compressive atelectasis. 5. Suspect coronary artery calcifications. 6. Incidental note of hepatic steatosis and probable hepatomegaly in the upper abdomen. Electronically Signed   By: Narda RutherfordMelanie  Sanford M.D.   On: 05/23/2020 18:43   ECHOCARDIOGRAM COMPLETE  Result Date: 05/24/2020    ECHOCARDIOGRAM REPORT   Patient Name:   Sallee ProvencalLMER Donica Date of Exam: 05/24/2020 Medical Rec #:  [id number]    Height:       72.0 in Accession #:    [id number]   Weight:       202.8 lb Date of Birth:  [birth year]    BSA:          2.143 m Patient Age:    45 years     BP:           131/80 mmHg Patient Gender: M            HR:           99 bpm. Exam Location:  Inpatient  Procedure: 2D Echo, Cardiac Doppler and Color Doppler Indications:    CHF  History:        Patient has no prior history of Echocardiogram examinations.                 CHF, Signs/Symptoms:Shortness of Breath; Risk                 Factors:Hypertension.  Sonographer:    Neomia DearAMARA CROWN RDCS Referring Phys: [id number] Deno LungerGEORGE J [facility] CampusHALHOUB  Sonographer Comments: Suboptimal subcostal window. IMPRESSIONS  1. Left ventricular ejection fraction, by estimation, is 65 to 70%. The left ventricle has hyperdynamic function. The left ventricle has no regional wall motion abnormalities. There is mild left ventricular hypertrophy. Left ventricular diastolic parameters were normal.  2. Right ventricular systolic function is normal. The right ventricular size is normal. There is normal pulmonary artery systolic pressure. The estimated right ventricular systolic pressure is 29.8 mmHg.  3. Left atrial size was mildly dilated.  4. The aortic valve is tricuspid. Mild aortic valve sclerosis is present. Mean aortic valve gradient 17 mmHg, but calculated AVA > 2 cm^2. The valve appears to open well, doubt clinically significant stenosis (possibly elevated gradient due to high flow).  5. The mitral valve is normal in structure. No evidence of mitral valve regurgitation. No evidence of mitral stenosis.  6. Aortic dilatation noted. There is mild dilatation of the aortic root, measuring 37 mm.  7. The inferior vena cava is normal in size with  greater than 50% respiratory variability, suggesting right atrial pressure of 3 mmHg. FINDINGS  Left Ventricle: Left ventricular ejection fraction, by estimation, is 65 to 70%. The left ventricle has hyperdynamic function. The left ventricle has no regional wall motion abnormalities. The left ventricular internal cavity size was normal in size. There is mild left ventricular hypertrophy. Left ventricular diastolic parameters were normal. Right Ventricle: The right ventricular size is normal. No increase in right  ventricular wall thickness. Right ventricular systolic function is normal. There is normal pulmonary artery systolic pressure. The tricuspid regurgitant velocity is 2.59 m/s, and  with an assumed right atrial pressure of 3 mmHg, the estimated right ventricular systolic pressure is 29.8 mmHg. Left Atrium: Left atrial size was mildly dilated. Right Atrium: Right atrial size was normal in size. Pericardium: Trivial pericardial effusion is present. Mitral Valve: The mitral valve is normal in structure. No evidence of mitral valve regurgitation. No evidence of mitral valve stenosis. Tricuspid Valve: The tricuspid valve is normal in structure. Tricuspid valve regurgitation is trivial. Aortic Valve: The aortic valve is tricuspid. Aortic valve regurgitation is not visualized. Mild aortic valve sclerosis is present, with no evidence of aortic valve stenosis. Aortic valve mean gradient measures 15.0 mmHg. Aortic valve peak gradient measures 26.2 mmHg. Aortic valve area, by VTI measures 2.51 cm. Pulmonic Valve: The pulmonic valve was normal in structure. Pulmonic valve regurgitation is not visualized. Aorta: Aortic dilatation noted. There is mild dilatation of the aortic root, measuring 37 mm. Venous: The inferior vena cava is normal in size with greater than 50% respiratory variability, suggesting right atrial pressure of 3 mmHg. IAS/Shunts: No atrial level shunt detected by color flow Doppler.  LEFT VENTRICLE PLAX 2D LVIDd:         5.10 cm      Diastology LVIDs:         2.60 cm      LV e' medial:    12.40 cm/s LV PW:         1.20 cm      LV E/e' medial:  11.5 LV IVS:        1.20 cm      LV e' lateral:   9.68 cm/s LVOT diam:     2.00 cm      LV E/e' lateral: 14.8 LV SV:         113 LV SV Index:   53 LVOT Area:     3.14 cm  LV Volumes (MOD) LV vol d, MOD A2C: 108.0 ml LV vol d, MOD A4C: 139.0 ml LV vol s, MOD A2C: 33.4 ml LV vol s, MOD A4C: 28.0 ml LV SV MOD A2C:     74.6 ml LV SV MOD A4C:     139.0 ml LV SV MOD BP:       93.5 ml  PULMONARY VEINS A Reversal Duration: 111.00 msec A Reversal Velocity: 43.90 cm/s Diastolic Velocity:  113.00 cm/s S/D Velocity:        0.70 Systolic Velocity:   82.70 cm/s LEFT ATRIUM             Index       RIGHT ATRIUM           Index LA diam:        3.30 cm 1.54 cm/m  RA Area:     12.20 cm LA Vol (A2C):   90.0 ml 41.99 ml/m RA Volume:   24.50 ml  11.43 ml/m LA Vol (A4C):   56.7 ml 26.45  ml/m LA Biplane Vol: 76.0 ml 35.46 ml/m  AORTIC VALVE                    PULMONIC VALVE AV Area (Vmax):    2.49 cm     PV Vmax:       1.23 m/s AV Area (Vmean):   2.33 cm     PV Vmean:      86.700 cm/s AV Area (VTI):     2.51 cm     PV VTI:        0.211 m AV Vmax:           256.00 cm/s  PV Peak grad:  6.1 mmHg AV Vmean:          180.000 cm/s PV Mean grad:  4.0 mmHg AV VTI:            0.450 m AV Peak Grad:      26.2 mmHg AV Mean Grad:      15.0 mmHg LVOT Vmax:         202.50 cm/s LVOT Vmean:        133.500 cm/s LVOT VTI:          0.360 m LVOT/AV VTI ratio: 0.80  AORTA Ao Root diam: 3.70 cm Ao Asc diam:  3.30 cm MITRAL VALVE                TRICUSPID VALVE MV Area (PHT): 3.16 cm     TR Peak grad:   26.8 mmHg MV Decel Time: 240 msec     TR Vmax:        259.00 cm/s MV E velocity: 143.00 cm/s MV A velocity: 130.00 cm/s  SHUNTS MV E/A ratio:  1.10         Systemic VTI:  0.36 m                             Systemic Diam: 2.00 cm Marca Ancona MD Electronically signed by Marca Ancona MD Signature Date/Time: 05/24/2020/1:42:04 PM    Final    [place] ASCITES (ABDOMEN LIMITED)  Result Date: 05/24/2020 CLINICAL DATA:  Ascites check EXAM: LIMITED ABDOMEN ULTRASOUND FOR ASCITES TECHNIQUE: Limited ultrasound survey for ascites was performed in all four abdominal quadrants. COMPARISON:  None. FINDINGS: Small volume ascites predominantly located within the right upper quadrant along the liver without pocket measuring approximately 6.4 cm in diameter. IMPRESSION: Small volume ascites predominantly located within the right upper  quadrant. Electronically Signed   By: Maudry Mayhew MD   On: 05/24/2020 00:14   US Abdomen Limited RUQ (LIVER/GB)  Result Date: 05/24/2020 CLINICAL DATA:  Cirrhosis, ascites EXAM: ULTRASOUND ABDOMEN LIMITED RIGHT UPPER QUADRANT COMPARISON:  None. FINDINGS: Gallbladder: A small amount of layering sludge is seen within the gallbladder lumen. The gallbladder, however, is not distended and there is no gallbladder wall thickening identified. There is trace pericholecystic fluid, nonspecific in the setting of ascites. The sonographic Eulah Pont sign is reportedly negative. Common bile duct: Diameter: 3 mm in proximal diameter Liver: The hepatic parenchyma is diffusely markedly echogenic and there is poor acoustic through transmission and diffuse coarsening of the hepatic echotexture. Altogether, the findings are in keeping with moderate to severe hepatic steatosis. Liver size is mildly enlarged. No definite focal intrahepatic masses are identified though parenchymal changes decreased sensitivity for detection of such masses. There is no intrahepatic biliary ductal dilation identified. Portal vein is patent on color Doppler imaging with  normal direction of blood flow towards the liver. Other: Trace perihepatic ascites is noted. IMPRESSION: Cholelithiasis without sonographic evidence of acute cholecystitis. Moderate to severe hepatic steatosis.  Mild hepatomegaly. Mild perihepatic ascites. Electronically Signed   By: Helyn Numbers MD   On: 05/24/2020 07:28    Medications:   . amLODipine  10 mg Oral Daily  . dextroamphetamine  20 mg Oral BID  . folic acid  1 mg Oral Daily  . furosemide  40 mg Intravenous BID  . LORazepam  0-4 mg Oral Q6H   Followed by  . [START ON 05/26/2020] LORazepam  0-4 mg Oral Q12H  . multivitamin with minerals  1 tablet Oral Daily  . pantoprazole (PROTONIX) IV  40 mg Intravenous Q12H  . polyethylene glycol-electrolytes  4,000 mL Oral Once  . spironolactone  25 mg Oral Daily  . thiamine   100 mg Oral Daily   Or  . thiamine  100 mg Intravenous Daily   Continuous Infusions: . azithromycin 500 mg (05/24/20 2203)  . cefTRIAXone (ROCEPHIN)  IV 2 g (05/24/20 2154)     LOS: 1 day   Joseph Art  [facility]   How to contact the [facility] Attending or Consulting provider 7A - 7P or covering provider during after hours 7P -7A, for this patient?  1. Check the care team in [facility] and look for a) attending/consulting [facility] provider listed and b) the [facility] team listed 2. Log into [email or web address] and use [facility]'s universal password to access. If you do not have the password, please contact the hospital operator. 3. Locate the [facility] provider you are looking for under [facility] and page to a number that you can be directly reached. 4. If you still have difficulty reaching the provider, please page the [facility] (Director on Call) for the Hospitalists listed on amion for assistance.  05/25/2020, 12:52 PM

## 2020-05-25 NOTE — H&P (View-Only) (Signed)
Subjective: Feeling better.  His SOB has improved.  Objective: Vital signs in last 24 hours: Temp:  [98.2 F (36.8 C)-99.1 F (37.3 C)] 98.2 F (36.8 C) (03/29 0446) Pulse Rate:  [99-104] 99 (03/29 0446) Resp:  [16-20] 18 (03/29 0446) BP: (115-129)/(71-80) 115/78 (03/29 0446) SpO2:  [91 %-99 %] 93 % (03/29 0446) Last BM Date: 05/21/20  Intake/Output from previous day: 03/28 0701 - 03/29 0700 In: 55.2 [I.V.:55.2] Out: 1000 [Urine:1000] Intake/Output this shift: Total I/O In: 60 [P.O.:60] Out: 300 [Urine:300]  General appearance: alert and no distress Resp: clear to auscultation bilaterally Cardio: regular rate and rhythm GI: soft, non-tender; bowel sounds normal; no masses,  no organomegaly Extremities: extremities normal, atraumatic, no cyanosis or edema  Lab Results: Recent Labs    05/23/20 1501 05/24/20 0516 05/24/20 1835 05/25/20 0252  WBC 19.6* 19.4*  --  17.7*  HGB 5.9* 6.8* 7.1* 7.0*  HCT 19.2* 20.8* 20.9* 21.0*  PLT 253 251  --  210   BMET Recent Labs    05/24/20 1835 05/24/20 2208 05/25/20 0252  NA 122* 123* 123*  K 3.7 3.6 3.7  CL 88* 88* 89*  CO2 25 26 25   GLUCOSE 91 98 92  BUN 6 6 5*  CREATININE 0.65 0.70 0.64  CALCIUM 7.9* 7.8* 7.8*   LFT Recent Labs    05/23/20 2203 05/24/20 0743 05/25/20 0252  PROT 7.2   < > 7.1  ALBUMIN 2.2*   < > 2.1*  AST 132*   < > 114*  ALT 17   < > 15  ALKPHOS 156*   < > 145*  BILITOT 6.7*   < > 8.1*  BILIDIR 3.5*  --   --   IBILI 3.2*  --   --    < > = values in this interval not displayed.   PT/INR Recent Labs    05/23/20 2245 05/24/20 0743  LABPROT 17.4* 17.9*  INR 1.5* 1.5*   Hepatitis Panel No results for input(s): HEPBSAG, HCVAB, HEPAIGM, HEPBIGM in the last 72 hours. C-Diff No results for input(s): CDIFFTOX in the last 72 hours. Fecal Lactopherrin No results for input(s): FECLLACTOFRN in the last 72 hours.  Studies/Results: DG Chest 2 View  Result Date: 05/23/2020 CLINICAL DATA:   Shortness of breath EXAM: CHEST - 2 VIEW COMPARISON:  None. FINDINGS: Cardiomegaly. There is a rounded, masslike opacity of the central right upper lobe which measures approximately 4.2 cm in projection. There is superimposed heterogeneous and interstitial airspace opacity, particularly of the perihilar left lung. Small left, trace right pleural effusions. The visualized skeletal structures are unremarkable. IMPRESSION: 1. There is a rounded, masslike opacity of the central right upper lobe which measures approximately 4.2 cm in projection. This is concerning for malignancy, although may reflect infectious airspace disease. Consider CT to further evaluate. Absolute minimum recommend radiographic follow-up at 6-8 weeks to ensure complete resolution. 2. There is superimposed heterogeneous and interstitial airspace opacity, particularly of the perihilar left lung. Small left, trace right pleural effusions. Findings suggest edema or infection. 3. Cardiomegaly. Electronically Signed   By: 05/25/2020 M.D.   On: 05/23/2020 15:19   CT Angio Chest PE W/Cm &/Or Wo Cm  Result Date: 05/23/2020 CLINICAL DATA:  PE suspected, low/intermediate prob, positive D-dimer Shortness of breath, possible PE, lung mass EXAM: CT ANGIOGRAPHY CHEST WITH CONTRAST TECHNIQUE: Multidetector CT imaging of the chest was performed using the standard protocol during bolus administration of intravenous contrast. Multiplanar CT image reconstructions and MIPs were obtained  to evaluate the vascular anatomy. CONTRAST:  3mL OMNIPAQUE IOHEXOL 350 MG/ML SOLN COMPARISON:  Radiograph earlier today. FINDINGS: Cardiovascular: Evaluation for pulmonary embolus is diagnostic to the lobar levels in the mid and upper lung zones and segmental levels at the bases. No evidence of pulmonary embolus. Normal caliber thoracic aorta without acute aortic abnormality. Upper normal heart size. No pericardial effusion. Suggestion of scattered coronary artery calcifications,  not well assessed due to motion on the current exam. Mediastinum/Nodes: Small mediastinal lymph nodes not enlarged by size criteria. No enlarged hilar lymph nodes. No axillary adenopathy. Patulous esophagus. No esophageal wall thickening. No suspicious thyroid nodule. Lungs/Pleura: There is no pulmonary mass. The masslike opacity on radiograph corresponds to a rounded area of ground-glass and consolidative opacity in the right upper lobe. Similar ground-glass and consolidative opacities within the apical and posterior segments of the right upper lobe, anterior left upper lobe, lingula, and to a lesser extent right middle and lower lobe. Some of these ground-glass opacities appear slightly nodular. Small to moderate left pleural effusion with adjacent compressive atelectasis. Trace right pleural effusion with adjacent atelectasis. Trace fluid in the fissures. There is minimal basilar septal thickening. Upper Abdomen: Diffuse hepatic steatosis. Included liver appears enlarged. No acute upper abdominal findings. Musculoskeletal: There are no acute or suspicious osseous abnormalities. Review of the MIP images confirms the above findings. IMPRESSION: 1. No central pulmonary embolus. 2. Multifocal ground-glass and consolidative opacities throughout both lungs. Differential considerations include pulmonary edema or atypical pneumonia. 3. No pulmonary mass. Radiographic appearance in the right lung correlates to a rounded area of ground-glass and consolidative opacity in the right upper lobe, as described above. 4. Small to moderate left and trace right pleural effusions with adjacent compressive atelectasis. 5. Suspect coronary artery calcifications. 6. Incidental note of hepatic steatosis and probable hepatomegaly in the upper abdomen. Electronically Signed   By: Narda Rutherford M.D.   On: 05/23/2020 18:43   ECHOCARDIOGRAM COMPLETE  Result Date: 05/24/2020    ECHOCARDIOGRAM REPORT   Patient Name:   Edward Valdez Date  of Exam: 05/24/2020 Medical Rec #:  [id number]    Height:       72.0 in Accession #:    [id number]   Weight:       202.8 lb Date of Birth:  11-14-1975    BSA:          2.143 m Patient Age:    45 years     BP:           131/80 mmHg Patient Gender: M            HR:           99 bpm. Exam Location:  Inpatient Procedure: 2D Echo, Cardiac Doppler and Color Doppler Indications:    CHF  History:        Patient has no prior history of Echocardiogram examinations.                 CHF, Signs/Symptoms:Shortness of Breath; Risk                 Factors:Hypertension.  Sonographer:    Neomia Dear RDCS Referring Phys: [id number] Deno [facility]  Sonographer Comments: Suboptimal subcostal window. IMPRESSIONS  1. Left ventricular ejection fraction, by estimation, is 65 to 70%. The left ventricle has hyperdynamic function. The left ventricle has no regional wall motion abnormalities. There is mild left ventricular hypertrophy. Left ventricular diastolic parameters were normal.  2. Right ventricular systolic function is  normal. The right ventricular size is normal. There is normal pulmonary artery systolic pressure. The estimated right ventricular systolic pressure is 29.8 mmHg.  3. Left atrial size was mildly dilated.  4. The aortic valve is tricuspid. Mild aortic valve sclerosis is present. Mean aortic valve gradient 17 mmHg, but calculated AVA > 2 cm^2. The valve appears to open well, doubt clinically significant stenosis (possibly elevated gradient due to high flow).  5. The mitral valve is normal in structure. No evidence of mitral valve regurgitation. No evidence of mitral stenosis.  6. Aortic dilatation noted. There is mild dilatation of the aortic root, measuring 37 mm.  7. The inferior vena cava is normal in size with greater than 50% respiratory variability, suggesting right atrial pressure of 3 mmHg. FINDINGS  Left Ventricle: Left ventricular ejection fraction, by estimation, is 65 to 70%. The left ventricle has hyperdynamic  function. The left ventricle has no regional wall motion abnormalities. The left ventricular internal cavity size was normal in size. There is mild left ventricular hypertrophy. Left ventricular diastolic parameters were normal. Right Ventricle: The right ventricular size is normal. No increase in right ventricular wall thickness. Right ventricular systolic function is normal. There is normal pulmonary artery systolic pressure. The tricuspid regurgitant velocity is 2.59 m/s, and  with an assumed right atrial pressure of 3 mmHg, the estimated right ventricular systolic pressure is 29.8 mmHg. Left Atrium: Left atrial size was mildly dilated. Right Atrium: Right atrial size was normal in size. Pericardium: Trivial pericardial effusion is present. Mitral Valve: The mitral valve is normal in structure. No evidence of mitral valve regurgitation. No evidence of mitral valve stenosis. Tricuspid Valve: The tricuspid valve is normal in structure. Tricuspid valve regurgitation is trivial. Aortic Valve: The aortic valve is tricuspid. Aortic valve regurgitation is not visualized. Mild aortic valve sclerosis is present, with no evidence of aortic valve stenosis. Aortic valve mean gradient measures 15.0 mmHg. Aortic valve peak gradient measures 26.2 mmHg. Aortic valve area, by VTI measures 2.51 cm. Pulmonic Valve: The pulmonic valve was normal in structure. Pulmonic valve regurgitation is not visualized. Aorta: Aortic dilatation noted. There is mild dilatation of the aortic root, measuring 37 mm. Venous: The inferior vena cava is normal in size with greater than 50% respiratory variability, suggesting right atrial pressure of 3 mmHg. IAS/Shunts: No atrial level shunt detected by color flow Doppler.  LEFT VENTRICLE PLAX 2D LVIDd:         5.10 cm      Diastology LVIDs:         2.60 cm      LV e' medial:    12.40 cm/s LV PW:         1.20 cm      LV E/e' medial:  11.5 LV IVS:        1.20 cm      LV e' lateral:   9.68 cm/s LVOT diam:      2.00 cm      LV E/e' lateral: 14.8 LV SV:         113 LV SV Index:   53 LVOT Area:     3.14 cm  LV Volumes (MOD) LV vol d, MOD A2C: 108.0 ml LV vol d, MOD A4C: 139.0 ml LV vol s, MOD A2C: 33.4 ml LV vol s, MOD A4C: 28.0 ml LV SV MOD A2C:     74.6 ml LV SV MOD A4C:     139.0 ml LV SV MOD BP:  93.5 ml  PULMONARY VEINS A Reversal Duration: 111.00 msec A Reversal Velocity: 43.90 cm/s Diastolic Velocity:  113.00 cm/s S/D Velocity:        0.70 Systolic Velocity:   82.70 cm/s LEFT ATRIUM             Index       RIGHT ATRIUM           Index LA diam:        3.30 cm 1.54 cm/m  RA Area:     12.20 cm LA Vol (A2C):   90.0 ml 41.99 ml/m RA Volume:   24.50 ml  11.43 ml/m LA Vol (A4C):   56.7 ml 26.45 ml/m LA Biplane Vol: 76.0 ml 35.46 ml/m  AORTIC VALVE                    PULMONIC VALVE AV Area (Vmax):    2.49 cm     PV Vmax:       1.23 m/s AV Area (Vmean):   2.33 cm     PV Vmean:      86.700 cm/s AV Area (VTI):     2.51 cm     PV VTI:        0.211 m AV Vmax:           256.00 cm/s  PV Peak grad:  6.1 mmHg AV Vmean:          180.000 cm/s PV Mean grad:  4.0 mmHg AV VTI:            0.450 m AV Peak Grad:      26.2 mmHg AV Mean Grad:      15.0 mmHg LVOT Vmax:         202.50 cm/s LVOT Vmean:        133.500 cm/s LVOT VTI:          0.360 m LVOT/AV VTI ratio: 0.80  AORTA Ao Root diam: 3.70 cm Ao Asc diam:  3.30 cm MITRAL VALVE                TRICUSPID VALVE MV Area (PHT): 3.16 cm     TR Peak grad:   26.8 mmHg MV Decel Time: 240 msec     TR Vmax:        259.00 cm/s MV E velocity: 143.00 cm/s MV A velocity: 130.00 cm/s  SHUNTS MV E/A ratio:  1.10         Systemic VTI:  0.36 m                             Systemic Diam: 2.00 cm Marca Ancona MD Electronically signed by Marca Ancona MD Signature Date/Time: 05/24/2020/1:42:04 PM    Final    [place] ASCITES (ABDOMEN LIMITED)  Result Date: 05/24/2020 CLINICAL DATA:  Ascites check EXAM: LIMITED ABDOMEN ULTRASOUND FOR ASCITES TECHNIQUE: Limited ultrasound survey for ascites was  performed in all four abdominal quadrants. COMPARISON:  None. FINDINGS: Small volume ascites predominantly located within the right upper quadrant along the liver without pocket measuring approximately 6.4 cm in diameter. IMPRESSION: Small volume ascites predominantly located within the right upper quadrant. Electronically Signed   By: Maudry Mayhew MD   On: 05/24/2020 00:14   US Abdomen Limited RUQ (LIVER/GB)  Result Date: 05/24/2020 CLINICAL DATA:  Cirrhosis, ascites EXAM: ULTRASOUND ABDOMEN LIMITED RIGHT UPPER QUADRANT COMPARISON:  None. FINDINGS: Gallbladder: A small amount of layering sludge is seen  within the gallbladder lumen. The gallbladder, however, is not distended and there is no gallbladder wall thickening identified. There is trace pericholecystic fluid, nonspecific in the setting of ascites. The sonographic Eulah PontMurphy sign is reportedly negative. Common bile duct: Diameter: 3 mm in proximal diameter Liver: The hepatic parenchyma is diffusely markedly echogenic and there is poor acoustic through transmission and diffuse coarsening of the hepatic echotexture. Altogether, the findings are in keeping with moderate to severe hepatic steatosis. Liver size is mildly enlarged. No definite focal intrahepatic masses are identified though parenchymal changes decreased sensitivity for detection of such masses. There is no intrahepatic biliary ductal dilation identified. Portal vein is patent on color Doppler imaging with normal direction of blood flow towards the liver. Other: Trace perihepatic ascites is noted. IMPRESSION: Cholelithiasis without sonographic evidence of acute cholecystitis. Moderate to severe hepatic steatosis.  Mild hepatomegaly. Mild perihepatic ascites. Electronically Signed   By: Helyn NumbersAshesh  Parikh MD   On: 05/24/2020 07:28    Medications:  Scheduled: . amLODipine  10 mg Oral Daily  . dextroamphetamine  20 mg Oral BID  . folic acid  1 mg Oral Daily  . furosemide  40 mg Intravenous BID   . LORazepam  0-4 mg Oral Q6H   Followed by  . [START ON 05/26/2020] LORazepam  0-4 mg Oral Q12H  . multivitamin with minerals  1 tablet Oral Daily  . pantoprazole (PROTONIX) IV  40 mg Intravenous Q12H  . polyethylene glycol-electrolytes  4,000 mL Oral Once  . spironolactone  25 mg Oral Daily  . thiamine  100 mg Oral Daily   Or  . thiamine  100 mg Intravenous Daily   Continuous: . azithromycin 500 mg (05/24/20 2203)  . cefTRIAXone (ROCEPHIN)  IV 2 g (05/24/20 2154)    Assessment/Plan: 1) Anemia. 2) Heme positive stool. 3) ETOH abuse. 4) 4.2 cm lesion in the RUL of the lung.   The patient reports feel much better.  Minor movements no longer cause him to be short of breath.  His HGB is much better after the transfusions.  Plan: 1) EGD/colonoscopy with Dr. Loreta AveMann tomorrow.  LOS: 1 day   Loukas Antonson D 05/25/2020, 10:33 AM

## 2020-05-25 NOTE — Progress Notes (Signed)
Patient/family would like to sign consent tomorrow after they speak with the GI team.  Prepared consent in chart.

## 2020-05-25 NOTE — Progress Notes (Signed)
1910:Patient is refusing to drink his bowel prep. He states " this tastes nasty". I encouraged patient to drink by pouring his bowel prep into a cup.  2116: Patient noted to be sleeping. I asked him to wake up and drink some of his prep since he cannot drink anything past midnight. He was seen drinking half a cup. 2200: Went to patient's room to check on patient. On arrival, I noted he had poured half of his prep into the commode.  2237: Call Elnoria Howard called and made aware patient is refusing to drink his prep and has not had a bowl movement

## 2020-05-26 ENCOUNTER — Encounter (HOSPITAL_COMMUNITY)
Admission: EM | Disposition: A | Discharge status: Home / Self Care | Payer: Self-pay | Source: Home / Self Care | Attending: Internal Medicine

## 2020-05-26 ENCOUNTER — Inpatient Hospital Stay (HOSPITAL_COMMUNITY): Payer: BC Managed Care – PPO | Admitting: Certified Registered Nurse Anesthetist

## 2020-05-26 ENCOUNTER — Encounter (HOSPITAL_COMMUNITY): Payer: Self-pay | Admitting: Internal Medicine

## 2020-05-26 HISTORY — PX: ESOPHAGOGASTRODUODENOSCOPY (EGD) WITH PROPOFOL: SHX5813

## 2020-05-26 LAB — BASIC METABOLIC PANEL
Anion gap: 8 (ref 5–15)
BUN: <5 mg/dL — ABNORMAL LOW (ref 6–20)
CO2: 27 mmol/L (ref 22–32)
Calcium: 7.8 mg/dL — ABNORMAL LOW (ref 8.9–10.3)
Chloride: 91 mmol/L — ABNORMAL LOW (ref 98–111)
Creatinine, Ser: 0.66 mg/dL (ref 0.61–1.24)
GFR, Estimated: >60 mL/min (ref >60)
Glucose, Bld: 83 mg/dL (ref 70–99)
Potassium: 3.1 mmol/L — ABNORMAL LOW (ref 3.5–5.1)
Sodium: 126 mmol/L — ABNORMAL LOW (ref 135–145)

## 2020-05-26 LAB — CBC
HCT: 21.9 % — ABNORMAL LOW (ref 39.0–52.0)
Hemoglobin: 7.2 g/dL — ABNORMAL LOW (ref 13.0–17.0)
MCH: 28.9 pg (ref 26.0–34.0)
MCHC: 32.9 g/dL (ref 30.0–36.0)
MCV: 88 fL (ref 80.0–100.0)
Platelets: 209 10*3/uL (ref 150–400)
RBC: 2.49 MIL/uL — ABNORMAL LOW (ref 4.22–5.81)
RDW: 19.9 % — ABNORMAL HIGH (ref 11.5–15.5)
WBC: 17.6 10*3/uL — ABNORMAL HIGH (ref 4.0–10.5)
nRBC: 0 % (ref 0.0–0.2)

## 2020-05-26 LAB — MAGNESIUM: Magnesium: 1.9 mg/dL (ref 1.7–2.4)

## 2020-05-26 LAB — HEPATIC FUNCTION PANEL
ALT: 14 U/L (ref 0–44)
AST: 103 U/L — ABNORMAL HIGH (ref 15–41)
Albumin: 2.1 g/dL — ABNORMAL LOW (ref 3.5–5.0)
Alkaline Phosphatase: 132 U/L — ABNORMAL HIGH (ref 38–126)
Bilirubin, Direct: 4.4 mg/dL — ABNORMAL HIGH (ref 0.0–0.2)
Indirect Bilirubin: 4.1 mg/dL — ABNORMAL HIGH (ref 0.3–0.9)
Total Bilirubin: 8.5 mg/dL — ABNORMAL HIGH (ref 0.3–1.2)
Total Protein: 6.8 g/dL (ref 6.5–8.1)

## 2020-05-26 SURGERY — ESOPHAGOGASTRODUODENOSCOPY (EGD) WITH PROPOFOL
Anesthesia: Monitor Anesthesia Care

## 2020-05-26 MED ORDER — DEXMEDETOMIDINE (PRECEDEX) IN NS 20 MCG/5ML (4 MCG/ML) IV SYRINGE
PREFILLED_SYRINGE | INTRAVENOUS | Status: DC | PRN
  Administered 2020-05-26: 8 ug via INTRAVENOUS

## 2020-05-26 MED ORDER — LACTATED RINGERS IV SOLN: INTRAVENOUS | Status: DC | PRN

## 2020-05-26 MED ORDER — POTASSIUM CHLORIDE CRYS ER 20 MEQ PO TBCR
40.0000 meq | EXTENDED_RELEASE_TABLET | Freq: Once | ORAL | Status: AC
  Administered 2020-05-26: 40 meq via ORAL
  Filled 2020-05-26: qty 2

## 2020-05-26 MED ORDER — PROPOFOL 500 MG/50ML IV EMUL
INTRAVENOUS | Status: DC | PRN
  Administered 2020-05-26: 150 ug/kg/min via INTRAVENOUS

## 2020-05-26 MED ORDER — PEG 3350-KCL-NA BICARB-NACL 420 G PO SOLR
4000.0000 mL | Freq: Once | ORAL | Status: AC
  Administered 2020-05-26: 4000 mL via ORAL
  Filled 2020-05-26: qty 4000

## 2020-05-26 MED ORDER — MAGNESIUM SULFATE 2 GM/50ML IV SOLN
2.0000 g | Freq: Once | INTRAVENOUS | Status: AC
  Administered 2020-05-26: 2 g via INTRAVENOUS
  Filled 2020-05-26: qty 50

## 2020-05-26 SURGICAL SUPPLY — 14 items

## 2020-05-26 NOTE — Progress Notes (Signed)
PROGRESS NOTE    Edward Valdez  KWI:[id number] DOB: [birth year] DOA: 05/23/2020 PCP: [facility]   Brief Narrative:  HPI on 05/23/2020 by Dr. Shauna Hugh 45 year old male with past medical history of alcohol abuse, hypertension who presents to [facility] emergency department with dyspnea on exertion.  Patient explains that for the past 2 weeks he has been experiencing shortness of breath.  The shortness of breath was initially mild in intensity but as the days progressed shortness of breath became more and more severe.  Shortness of breath is worse with exertion and improved with rest.  Over the span of time patient is complaining of associated bilateral lower extremity edema, increasing abdominal girth, generalized malaise and weakness as well as episodes of paroxysmal nocturnal dyspnea and progressive yellowing of the skin.  Patient additionally is complaining of episodic chest tightness.  Mild to moderate in intensity, located in the midsternal region without any alleviating or exacerbating factors.  Upon further questioning patient denies fever, sick contacts, recent travel or contact with confirmed COVID-19 infection.  Because of patient's progressively worsening symptoms patient's primary care provider sent the patient to [facility] emergency department for evaluation.  Upon evaluation in the emergency department patient has found to have multiple ongoing medical issues including bilateral infiltrates on chest imaging concerning for a combination of pneumonia and pulmonary edema.  Patient also found to have substantial hyponatremia of 119 and substantial anemia with hemoglobin of 5.9.  2 units of packed red blood cells were ordered for transfusion.  Patient was administered 40 mg of intravenous Lasix.  The hospitalist group was then called to assess the patient for admission to the hospital.  Interim history  Patient admitted with shortness  of breath is with exertion and lower extremity pain as well as increased abdominal girth and general malaise, progressive yellowing of the skin.  Gastroenterology consulted and appreciated.  Pending EGD and colonoscopy. Assessment & Plan   Shortness of breath -Patient presented with several week history of progressively worsening shortness of breath on exertion and paroxysmal nocturnal dyspnea -Symptoms are multifactorial in origin including anemia (patient presented with a hemoglobin of 5.9), pneumonia, pulmonary edema -Treating underlying conditions, patient did receive blood transfusion as well as IV Lasix, antibiotics and Aldactone -Continue supplemental oxygen and wean as possible -Echocardiogram obtained showing an EF of 65 to 70%, LV has hyperdynamic function.  No regional wall motion abnormalities.  Mild LVH.  Diastolic parameters are normal  Symptomatic anemia -As above, hemoglobin was 5.9 on admission -Patient received 3 units PRBC -Hemoglobin 7.2 -Continue to monitor CBC -Anemia be secondary to myelosuppression due to heavy alcohol abuse versus possible GI bleed -Continue IV PPI twice daily -Gastroenterology consulted and appreciated, pending EGD and colonoscopy  Lactic acidosis -Resolved  Sepsis secondary to Pneumonia -Present on admission, patient was noted to be tachycardic as well as tachypneic with leukocytosis -Chest x-ray on admission showed a masslike opacity right upper lobe measuring 4.2 cm in projection.  Concerning for malignancy but may reflect infectious airspace disease.  Consider CT to further evaluate as well as x-ray in 6 to 8 weeks. -CTA chest showed no central pulmonary embolism.  Multifocal groundglass and consolidative opacities throughout both lungs.  No pulmonary mass noted. -Continue ceftriaxone and azithromycin  Essential hypertension -Continue diuretics, Lasix as well as spironolactone -BP stable  Hyponatremia -Suspect secondary to beer Poto  mania/heavy alcohol abuse with volume overload -Sodium as low as 116 on admission, up to 126  today -Continue diuresis and monitor BMP  Alcohol dependence with uncomplicated withdrawal -Patient states that he drinks half of 1/5 of bourbon per day along with 8 shots minimum -Continue CIWA protocol -Cessation discussed  Alcoholic liver disease -Patient with evidence of jaundice on exam -Madreys discriminant function calculates to 16.8 which is suggestive that steroids are not necessary -Continue to monitor withdrawal -Discussed cessation  Elevated D-dimer -CTA negative -pending lower extremity doppler  Hypokalemia -Replacing continue to monitor BMP -Obtain magnesium level   DVT Prophylaxis  SCDs  Code Status: Full  Family Communication: none at bedside  Disposition Plan:  Status is: Inpatient  Remains inpatient appropriate because:IV treatments appropriate due to intensity of illness or inability to take PO and Inpatient level of care appropriate due to severity of illness   Dispo: The patient is from: Home              Anticipated d/c is to: Home              Patient currently is not medically stable to d/c.   Difficult to place patient No   Consultants Gastroenterology  Procedures  Abdominal ultrasound   Antibiotics   Anti-infectives (From admission, onward)   Start     Dose/Rate Route Frequency Ordered Stop   05/24/20 2000  [MAR Hold]  cefTRIAXone (ROCEPHIN) 2 g in sodium chloride 0.9 % 100 mL IVPB        (MAR Hold since Wed 05/26/2020 at 1423.Hold Reason: Transfer to a Procedural area.)   2 g 200 mL/hr over 30 Minutes Intravenous Every 24 hours 05/23/20 2321 05/29/20 1959   05/24/20 2000  [MAR Hold]  azithromycin (ZITHROMAX) 500 mg in sodium chloride 0.9 % 250 mL IVPB        (MAR Hold since Wed 05/26/2020 at 1423.Hold Reason: Transfer to a Procedural area.)   500 mg 250 mL/hr over 60 Minutes Intravenous Every 24 hours 05/23/20 2321 05/29/20 1959   05/23/20 2000   cefTRIAXone (ROCEPHIN) 1 g in sodium chloride 0.9 % 100 mL IVPB        1 g 200 mL/hr over 30 Minutes Intravenous  Once 05/23/20 1953 05/23/20 2118   05/23/20 2000  azithromycin (ZITHROMAX) 500 mg in sodium chloride 0.9 % 250 mL IVPB        500 mg 250 mL/hr over 60 Minutes Intravenous  Once 05/23/20 1953 05/24/20 0050      Subjective:   Sallee Provencal seen and examined today.  Continues to complain of shortness of breath with minimal activity and feels short of breath while laying flat in bed.  Denies current chest pain, abdominal pain, nausea or vomiting, dizziness or headache. Objective:   Vitals:   05/26/20 0426 05/26/20 1010 05/26/20 1245 05/26/20 1413  BP: 96/71  121/84 128/82  Pulse: 98  74   Resp: 20  16   Temp: 98.7 F (37.1 C)  98.8 F (37.1 C) 98.4 F (36.9 C)  TempSrc: Oral  Oral   SpO2: 96% 96% 100% 99%  Weight:      Height:        Intake/Output Summary (Last 24 hours) at 05/26/2020 1443 Last data filed at 05/26/2020 1435 Gross per 24 hour  Intake 460 ml  Output --  Net 460 ml   Filed Weights   05/23/20 2208  Weight: 92 kg    Exam  General: Well developed, acutely/chronically ill-appearing, NAD, jaundiced  HEENT: NCAT,  mucous membranes moist.   Cardiovascular: S1 S2 auscultated, RRR  Respiratory: Diminished breath sounds, increased work of breathing  Abdomen: Nontender, distended, + bowel sounds  Extremities: warm dry without cyanosis clubbing.  LE edema  Neuro: AAOx3, nonfocal  Psych: flat however appropriate   Data Reviewed: I have personally reviewed following labs and imaging studies  CBC: Recent Labs  Lab 05/23/20 1501 05/24/20 0516 05/24/20 1835 05/25/20 0252 05/25/20 1541 05/26/20 0727  WBC 19.6* 19.4*  --  17.7* 19.0* 17.6*  HGB 5.9* 6.8* 7.1* 7.0* 7.0* 7.2*  HCT 19.2* 20.8* 20.9* 21.0* 22.0* 21.9*  MCV 87.7 86.3  --  86.4 88.0 88.0  PLT 253 251  --  210 212 209   Basic Metabolic Panel: Recent Labs  Lab 05/23/20 2203  05/23/20 2245 05/24/20 0743 05/24/20 1835 05/24/20 2208 05/25/20 0252 05/25/20 1541 05/26/20 0727  NA  --    < > 120* 122* 123* 123* 123* 126*  K  --    < > 3.4* 3.7 3.6 3.7 3.4* 3.1*  CL  --    < > 86* 88* 88* 89* 89* 91*  CO2  --    < > 25 25 26 25 26 27   GLUCOSE  --    < > 98 91 98 92 105* 83  BUN  --    < > 6 6 6  5* 6 <5*  CREATININE  --    < > 0.62 0.65 0.70 0.64 0.67 0.66  CALCIUM  --    < > 8.1* 7.9* 7.8* 7.8* 7.8* 7.8*  MG 1.9  --  1.9  --   --   --   --   --    < > = values in this interval not displayed.   GFR: Estimated Creatinine Clearance: 125.3 mL/min (by C-G formula based on SCr of 0.66 mg/dL). Liver Function Tests: Recent Labs  Lab 05/23/20 2203 05/24/20 0743 05/25/20 0252 05/26/20 0727  AST 132* 130* 114* 103*  ALT 17 16 15 14   ALKPHOS 156* 165* 145* 132*  BILITOT 6.7* 7.4* 8.1* 8.5*  PROT 7.2 7.5 7.1 6.8  ALBUMIN 2.2* 2.3* 2.1* 2.1*   No results for input(s): LIPASE, AMYLASE in the last 168 hours. Recent Labs  Lab 05/23/20 2203  AMMONIA 51*   Coagulation Profile: Recent Labs  Lab 05/23/20 2245 05/24/20 0743  INR 1.5* 1.5*   Cardiac Enzymes: No results for input(s): CKTOTAL, CKMB, CKMBINDEX, TROPONINI in the last 168 hours. BNP (last 3 results) No results for input(s): PROBNP in the last 8760 hours. HbA1C: No results for input(s): HGBA1C in the last 72 hours. CBG: No results for input(s): GLUCAP in the last 168 hours. Lipid Profile: No results for input(s): CHOL, HDL, LDLCALC, TRIG, CHOLHDL, LDLDIRECT in the last 72 hours. Thyroid Function Tests: Recent Labs    05/23/20 2206  TSH 3.911   Anemia Panel: Recent Labs    05/23/20 2203  VITAMINB12 1,427*  FOLATE 2.2*  TIBC 237*  IRON 23*   Urine analysis:    Component Value Date/Time   COLORURINE AMBER (A) 05/24/2020 0024   APPEARANCEUR CLEAR 05/24/2020 0024   LABSPEC 1.017 05/24/2020 0024   PHURINE 6.0 05/24/2020 0024   GLUCOSEU NEGATIVE 05/24/2020 0024   HGBUR SMALL (A)  05/24/2020 0024   BILIRUBINUR NEGATIVE 05/24/2020 0024   KETONESUR NEGATIVE 05/24/2020 0024   PROTEINUR NEGATIVE 05/24/2020 0024   NITRITE NEGATIVE 05/24/2020 0024   LEUKOCYTESUR NEGATIVE 05/24/2020 0024   Sepsis Labs: @LABRCNTIP (procalcitonin:4,lacticidven:4)  ) Recent Results (from the past 240 hour(s))  Resp Panel by RT-PCR (  Flu A&B, Covid) Nasopharyngeal Swab     Status: None   Collection Time: 05/23/20  5:13 PM   Specimen: Nasopharyngeal Swab; Nasopharyngeal(NP) swabs in vial transport medium  Result Value Ref Range Status   SARS Coronavirus 2 by RT PCR NEGATIVE NEGATIVE Final    Comment: (NOTE) SARS-CoV-2 target nucleic acids are NOT DETECTED.  The SARS-CoV-2 RNA is generally detectable in upper respiratory specimens during the acute phase of infection. The lowest concentration of SARS-CoV-2 viral copies this assay can detect is 138 copies/mL. A negative result does not preclude SARS-Cov-2 infection and should not be used as the sole basis for treatment or other patient management decisions. A negative result may occur with  improper specimen collection/handling, submission of specimen other than nasopharyngeal swab, presence of viral mutation(s) within the areas targeted by this assay, and inadequate number of viral copies(<138 copies/mL). A negative result must be combined with clinical observations, patient history, and epidemiological information. The expected result is Negative.  Fact Sheet for Patients:  BloggerCourse.comhttps://www.fda.gov/media/152166/download  Fact Sheet for Healthcare Providers:  SeriousBroker.ithttps://www.fda.gov/media/152162/download  This test is no t yet approved or cleared by the [place] FDA and  has been authorized for detection and/or diagnosis of SARS-CoV-2 by FDA under an Emergency Use Authorization (EUA). This EUA will remain  in effect (meaning this test can be used) for the duration of the COVID-19 declaration under Section 564(b)(1) of the Act,  21 U.S.C.section 360bbb-3(b)(1), unless the authorization is terminated  or revoked sooner.       Influenza A by PCR NEGATIVE NEGATIVE Final   Influenza B by PCR NEGATIVE NEGATIVE Final    Comment: (NOTE) The Xpert Xpress SARS-CoV-2/FLU/RSV plus assay is intended as an aid in the diagnosis of influenza from Nasopharyngeal swab specimens and should not be used as a sole basis for treatment. Nasal washings and aspirates are unacceptable for Xpert Xpress SARS-CoV-2/FLU/RSV testing.  Fact Sheet for Patients: BloggerCourse.comhttps://www.fda.gov/media/152166/download  Fact Sheet for Healthcare Providers: SeriousBroker.ithttps://www.fda.gov/media/152162/download  This test is not yet approved or cleared by the [place] FDA and has been authorized for detection and/or diagnosis of SARS-CoV-2 by FDA under an Emergency Use Authorization (EUA). This EUA will remain in effect (meaning this test can be used) for the duration of the COVID-19 declaration under Section 564(b)(1) of the Act, 21 U.S.C. section 360bbb-3(b)(1), unless the authorization is terminated or revoked.  Performed at [facility], [address] [place], [place] [address]   Culture, blood (routine x 2)     Status: None (Preliminary result)   Collection Time: 05/24/20  5:16 AM   Specimen: BLOOD LEFT ARM  Result Value Ref Range Status   Specimen Description BLOOD LEFT ARM  Final   Special Requests   Final    BOTTLES DRAWN AEROBIC ONLY Blood Culture results may not be optimal due to an inadequate volume of blood received in culture bottles   Culture   Final    NO GROWTH 2 DAYS Performed at [facility], [address] [place], [place] [address]    Report Status PENDING  Incomplete  Culture, blood (routine x 2)     Status: None (Preliminary result)   Collection Time: 05/24/20  5:16 AM   Specimen: BLOOD RIGHT ARM  Result Value Ref Range Status   Specimen Description BLOOD RIGHT ARM  Final   Special Requests   Final    BOTTLES  DRAWN AEROBIC ONLY Blood Culture results may not be optimal due to an inadequate volume  of blood received in culture bottles   Culture   Final    NO GROWTH 2 DAYS Performed at [facility], [address], [place], [place] [address]    Report Status PENDING  Incomplete      Radiology Studies: No results found.   Scheduled Meds: . [MAR Hold] dextroamphetamine  20 mg Oral BID  . [MAR Hold] folic acid  1 mg Oral Daily  . [MAR Hold] furosemide  60 mg Intravenous BID  . [MAR Hold] lactulose  10 g Oral Daily  . [MAR Hold] LORazepam  0-4 mg Oral Q12H  . [MAR Hold] multivitamin with minerals  1 tablet Oral Daily  . [MAR Hold] pantoprazole (PROTONIX) IV  40 mg Intravenous Q12H  . polyethylene glycol-electrolytes  4,000 mL Oral Once  . [MAR Hold] spironolactone  25 mg Oral Daily  . [MAR Hold] thiamine  100 mg Oral Daily   Or  . [MAR Hold] thiamine  100 mg Intravenous Daily   Continuous Infusions: . [MAR Hold] azithromycin 500 mg (05/25/20 2116)  . [MAR Hold] cefTRIAXone (ROCEPHIN)  IV 2 g (05/25/20 1955)     LOS: 2 days   Time Spent in minutes   45 minutes  Jabril Pursell D.O. on 05/26/2020 at 2:43 PM  Between 7am to 7pm - Please see pager noted on [email or web address]  After 7pm go to [email or web address]  And look for the night coverage person covering for me after hours  [facility] Office  [phone]

## 2020-05-26 NOTE — Progress Notes (Signed)
[facility] night shift.  The staff reported that the patient's potassium was 3.1 mmol/L and are requesting replacement.  The patient is currently being treated for alcohol withdrawal.  Given this background, I have added magnesium sulfate 2 g IVPB as well.  Sanda Klein, MD

## 2020-05-26 NOTE — Progress Notes (Signed)
Pt transported to endo by endo staff, pt GCS 15 cooperative without apparent sob/anxiety, saline lock rt hand intact. Pt wife remains in room

## 2020-05-26 NOTE — Op Note (Signed)
[facility] Patient Name: Edward Valdez Procedure Date : 05/26/2020 MRN: [id number] Attending MD: Juanita Craver , MD Date of Birth: 11-Apr-1975 CSN: [id number] Age: 45 Admit Type: Inpatient Procedure:                Diagnostic EGD. Indications:              Iron deficiency anemia; History of epistaxis. Providers:                Juanita Craver, MD, Kary Kos RN, RN, Janee Morn, Technician Referring MD:             [facility] Medicines:                Monitored Anesthesia Care Complications:            No immediate complications. Estimated Blood Loss:     Estimated blood loss: none. Procedure:                Pre-Anesthesia Assessment: - Prior to the                            procedure, a history and physical was performed,                            and patient medications and allergies were                            reviewed. The patient's tolerance of previous                            anesthesia was also reviewed. The risks and                            benefits of the procedure and the sedation options                            and risks were discussed with the patient. All                            questions were answered, and informed consent was                            obtained. Prior Anticoagulants: The patient has                            taken no previous anticoagulant or antiplatelet                            agents. ASA Grade Assessment: III - A patient with                            severe systemic disease. After reviewing the risks  and benefits, the patient was deemed in                            satisfactory condition to undergo the procedure.                            After obtaining informed consent, the endoscope was                            passed under direct vision. Throughout the                            procedure, the patient's blood pressure, pulse, and                             oxygen saturations were monitored continuously. The                            [id number]-H190 ([id number]) Olympus gastroscope was                            introduced through the mouth, and advanced to the                            second part of duodenum. The EGD was accomplished                            without difficulty. The patient tolerated the                            procedure well. Scope In: Scope Out: Findings:      The examined esophagus and GEJ appeared widely patent and normal; no       varices were noted in the esophagus.      The entire examined stomach was normal.      The cardia and gastric fundus were normal on retroflexion; no gastric       varices were noted.      The examined duodenum was normal. Impression:               - Normal appearing, widely patent esophagus and                            GEJ; no esophageal varices noted.                           - Normal appearing stomach; no evidence of varices.                           - Normal examined duodenum.                           - No specimens collected. Moderate Sedation:      MAC used. Recommendation:           - Clear liquid diet.                           -  Prep for a colonoscopy tomorrow. Procedure Code(s):        --- Professional ---                           [phone], Esophagogastroduodenoscopy, flexible,                            transoral; diagnostic, including collection of                            specimen(s) by brushing or washing, when performed                            (separate procedure) Diagnosis Code(s):        --- Professional ---                           D50.9, Iron deficiency anemia, unspecified CPT copyright 2019 American Medical Association. All rights reserved. The codes documented in this report are preliminary and upon coder review may  be revised to meet current compliance requirements. Juanita Craver, MD Juanita Craver, MD 05/26/2020 2:46:59 PM This report has been  signed electronically. Number of Addenda: 0

## 2020-05-26 NOTE — Anesthesia Preprocedure Evaluation (Addendum)
Anesthesia Evaluation  Patient identified by MRN, date of birth, ID band Patient awake    Reviewed: Allergy & Precautions, H&P , NPO status , Patient's Chart, lab work & pertinent test results  Airway Mallampati: II   Neck ROM: full    Dental   Pulmonary shortness of breath, pneumonia, unresolved,  Bilateral PNA. Currently on ABX   breath sounds clear to auscultation       Cardiovascular hypertension,  Rhythm:regular Rate:Normal  TTE (05/24/2020): EF 65-70%   Neuro/Psych    GI/Hepatic (+)     substance abuse  alcohol use, GI bleeding   Endo/Other    Renal/GU      Musculoskeletal   Abdominal   Peds  Hematology  (+) Blood dyscrasia, anemia ,   Anesthesia Other Findings   Reproductive/Obstetrics                            Anesthesia Physical Anesthesia Plan  ASA: III  Anesthesia Plan: MAC   Post-op Pain Management:    Induction: Intravenous  PONV Risk Score and Plan: 1 and Propofol infusion and Treatment may vary due to age or medical condition  Airway Management Planned: Nasal Cannula  Additional Equipment:   Intra-op Plan:   Post-operative Plan:   Informed Consent: I have reviewed the patients History and Physical, chart, labs and discussed the procedure including the risks, benefits and alternatives for the proposed anesthesia with the patient or authorized representative who has indicated his/her understanding and acceptance.     Dental advisory given  Plan Discussed with: CRNA, Anesthesiologist and Surgeon  Anesthesia Plan Comments:        Anesthesia Quick Evaluation

## 2020-05-26 NOTE — Progress Notes (Addendum)
Notified Dr Robb Matar of K 3.1 this a.m with labs.   See new orders.

## 2020-05-26 NOTE — Transfer of Care (Signed)
Immediate Anesthesia Transfer of Care Note  Patient: Edward Valdez  Procedure(s) Performed: ESOPHAGOGASTRODUODENOSCOPY (EGD) WITH PROPOFOL (N/A )  Patient Location: Endoscopy Unit  Anesthesia Type:MAC  Level of Consciousness: awake, alert  and oriented  Airway & Oxygen Therapy: Patient Spontanous Breathing and Patient connected to nasal cannula oxygen  Post-op Assessment: Report given to RN and Post -op Vital signs reviewed and stable  Post vital signs: Reviewed and stable  Last Vitals:  Vitals Value Taken Time  BP 103/70   Temp    Pulse 96   Resp 18   SpO2 100     Last Pain:  Vitals:   05/26/20 1245  TempSrc: Oral  PainSc:          Complications: No complications documented.

## 2020-05-26 NOTE — H&P (View-Only) (Signed)
Pt transported to endo by endo staff, pt GCS 15 cooperative without apparent sob/anxiety, saline lock rt hand intact. Pt wife remains in room 

## 2020-05-26 NOTE — Anesthesia Procedure Notes (Signed)
Procedure Name: MAC Date/Time: 05/26/2020 2:23 PM Performed by: Candis Shine, CRNA Pre-anesthesia Checklist: Patient identified, Emergency Drugs available, Suction available, Patient being monitored and Timeout performed Patient Re-evaluated:Patient Re-evaluated prior to induction Oxygen Delivery Method: Nasal cannula Dental Injury: Teeth and Oropharynx as per pre-operative assessment

## 2020-05-26 NOTE — Interval H&P Note (Signed)
History and Physical Interval Note:  05/26/2020 2:24 PM  Edward Valdez  has presented today for surgery, with the diagnosis of GI bleed.  The various methods of treatment have been discussed with the patient and family. After consideration of risks, benefits and other options for treatment, the patient has consented to  Procedure(s): ESOPHAGOGASTRODUODENOSCOPY (EGD) WITH PROPOFOL (N/A) as a surgical intervention.  The patient's history has been reviewed, patient examined, no change in status, stable for surgery.  I have reviewed the patient's chart and labs.  Questions were answered to the patient's satisfaction.     Charna Elizabeth

## 2020-05-27 ENCOUNTER — Inpatient Hospital Stay (HOSPITAL_COMMUNITY): Payer: BC Managed Care – PPO | Admitting: Certified Registered Nurse Anesthetist

## 2020-05-27 ENCOUNTER — Encounter (HOSPITAL_COMMUNITY)
Admission: EM | Disposition: A | Discharge status: Home / Self Care | Payer: Self-pay | Source: Home / Self Care | Attending: Internal Medicine

## 2020-05-27 ENCOUNTER — Encounter (HOSPITAL_COMMUNITY): Payer: Self-pay | Admitting: Gastroenterology

## 2020-05-27 HISTORY — PX: COLONOSCOPY WITH PROPOFOL: SHX5780

## 2020-05-27 HISTORY — PX: POLYPECTOMY: SHX5525

## 2020-05-27 LAB — HEPATIC FUNCTION PANEL
ALT: 16 U/L (ref 0–44)
AST: 101 U/L — ABNORMAL HIGH (ref 15–41)
Albumin: 2.1 g/dL — ABNORMAL LOW (ref 3.5–5.0)
Alkaline Phosphatase: 136 U/L — ABNORMAL HIGH (ref 38–126)
Bilirubin, Direct: 4.1 mg/dL — ABNORMAL HIGH (ref 0.0–0.2)
Indirect Bilirubin: 3.9 mg/dL — ABNORMAL HIGH (ref 0.3–0.9)
Total Bilirubin: 8 mg/dL — ABNORMAL HIGH (ref 0.3–1.2)
Total Protein: 7.1 g/dL (ref 6.5–8.1)

## 2020-05-27 LAB — BASIC METABOLIC PANEL
Anion gap: 9 (ref 5–15)
BUN: <5 mg/dL — ABNORMAL LOW (ref 6–20)
CO2: 27 mmol/L (ref 22–32)
Calcium: 7.8 mg/dL — ABNORMAL LOW (ref 8.9–10.3)
Chloride: 92 mmol/L — ABNORMAL LOW (ref 98–111)
Creatinine, Ser: 0.73 mg/dL (ref 0.61–1.24)
GFR, Estimated: >60 mL/min (ref >60)
Glucose, Bld: 97 mg/dL (ref 70–99)
Potassium: 3.1 mmol/L — ABNORMAL LOW (ref 3.5–5.1)
Sodium: 128 mmol/L — ABNORMAL LOW (ref 135–145)

## 2020-05-27 LAB — CBC
HCT: 23.5 % — ABNORMAL LOW (ref 39.0–52.0)
Hemoglobin: 7.3 g/dL — ABNORMAL LOW (ref 13.0–17.0)
MCH: 28.4 pg (ref 26.0–34.0)
MCHC: 31.1 g/dL (ref 30.0–36.0)
MCV: 91.4 fL (ref 80.0–100.0)
Platelets: 225 10*3/uL (ref 150–400)
RBC: 2.57 MIL/uL — ABNORMAL LOW (ref 4.22–5.81)
RDW: 20.2 % — ABNORMAL HIGH (ref 11.5–15.5)
WBC: 17.7 10*3/uL — ABNORMAL HIGH (ref 4.0–10.5)
nRBC: 0 % (ref 0.0–0.2)

## 2020-05-27 SURGERY — COLONOSCOPY WITH PROPOFOL
Anesthesia: Monitor Anesthesia Care

## 2020-05-27 MED ORDER — LACTATED RINGERS IV SOLN: INTRAVENOUS | Status: DC | PRN

## 2020-05-27 MED ORDER — POTASSIUM CHLORIDE 10 MEQ/100ML IV SOLN
10.0000 meq | INTRAVENOUS | Status: AC
Start: 2020-05-27 — End: 2020-05-27
  Administered 2020-05-27 (×3): 10 meq via INTRAVENOUS
  Filled 2020-05-27 (×3): qty 100

## 2020-05-27 MED ORDER — HEPARIN SOD (PORK) LOCK FLUSH 100 UNIT/ML IV SOLN
500.0000 [IU] | INTRAVENOUS | Status: DC | PRN
  Filled 2020-05-27: qty 5

## 2020-05-27 MED ORDER — SODIUM CHLORIDE 0.9 % IV SOLN: INTRAVENOUS | Status: DC

## 2020-05-27 MED ORDER — POTASSIUM CHLORIDE 10 MEQ/100ML IV SOLN
10.0000 meq | Freq: Once | INTRAVENOUS | Status: AC
  Administered 2020-05-27: 10 meq via INTRAVENOUS
  Filled 2020-05-27: qty 100

## 2020-05-27 MED ORDER — DEXMEDETOMIDINE (PRECEDEX) IN NS 20 MCG/5ML (4 MCG/ML) IV SYRINGE
PREFILLED_SYRINGE | INTRAVENOUS | Status: DC | PRN
  Administered 2020-05-27 (×4): 8 ug via INTRAVENOUS

## 2020-05-27 MED ORDER — PROPOFOL 10 MG/ML IV BOLUS
INTRAVENOUS | Status: DC | PRN
  Administered 2020-05-27 (×2): 100 mg via INTRAVENOUS

## 2020-05-27 MED ORDER — PHENYLEPHRINE 40 MCG/ML (10ML) SYRINGE FOR IV PUSH (FOR BLOOD PRESSURE SUPPORT)
PREFILLED_SYRINGE | INTRAVENOUS | Status: DC | PRN
  Administered 2020-05-27: 80 ug via INTRAVENOUS

## 2020-05-27 MED ORDER — PROPOFOL 500 MG/50ML IV EMUL
INTRAVENOUS | Status: DC | PRN
  Administered 2020-05-27: 175 ug/kg/min via INTRAVENOUS

## 2020-05-27 SURGICAL SUPPLY — 22 items

## 2020-05-27 NOTE — Anesthesia Postprocedure Evaluation (Signed)
Anesthesia Post Note  Patient: Chrisandra Carota  Procedure(s) Performed: COLONOSCOPY WITH PROPOFOL (N/A ) POLYPECTOMY     Patient location during evaluation: Endoscopy Anesthesia Type: MAC Level of consciousness: awake and alert Pain management: pain level controlled Vital Signs Assessment: post-procedure vital signs reviewed and stable Respiratory status: spontaneous breathing, nonlabored ventilation, respiratory function stable and patient connected to nasal cannula oxygen Cardiovascular status: blood pressure returned to baseline and stable Postop Assessment: no apparent nausea or vomiting Anesthetic complications: no   No complications documented.  Last Vitals:  Vitals:   05/27/20 1618 05/27/20 1707  BP: 115/70 105/60  Pulse: 87 87  Resp: 19 18  Temp:  36.7 C  SpO2: 92% 93%    Last Pain:  Vitals:   05/27/20 1707  TempSrc: Oral  PainSc:                  Reem Fleury COKER

## 2020-05-27 NOTE — Op Note (Signed)
[facility] Patient Name: Edward Valdez Procedure Date : 05/27/2020 MRN: [id number] Attending MD: Jeani HawkingPatrick Mirah Nevins , MD Date of Birth: 1975-08-11 CSN: [id number] Age: 45 Admit Type: Inpatient Procedure:                Colonoscopy Indications:              Iron deficiency anemia Providers:                Jeani HawkingPatrick Chong January, MD, Fayrene FearingLaura Turner, RN, Fransisca ConnorsMichael                            Williams, Denice Borshris Chandler Tech., Technician, Orvilla FusSarah                            Cato, CRNA Referring MD:              Medicines:                 Complications:            No immediate complications. Estimated Blood Loss:     Estimated blood loss was minimal. Procedure:                Pre-Anesthesia Assessment:                           - Prior to the procedure, a History and Physical                            was performed, and patient medications and                            allergies were reviewed. The patient's tolerance of                            previous anesthesia was also reviewed. The risks                            and benefits of the procedure and the sedation                            options and risks were discussed with the patient.                            All questions were answered, and informed consent                            was obtained. Prior Anticoagulants: The patient has                            taken no previous anticoagulant or antiplatelet                            agents. ASA Grade Assessment: III - A patient with                            severe systemic disease.  After reviewing the risks                            and benefits, the patient was deemed in                            satisfactory condition to undergo the procedure.                           - Sedation was administered by an anesthesia                            professional. Deep sedation was attained.                           After obtaining informed consent, the colonoscope                             was passed under direct vision. Throughout the                            procedure, the patient's blood pressure, pulse, and                            oxygen saturations were monitored continuously. The                            CF-HQ190L ([id number] Olympus colonoscope was                            introduced through the anus and advanced to the the                            cecum, identified by appendiceal orifice and                            ileocecal valve. The colonoscopy was somewhat                            difficult due to significant looping and a tortuous                            colon. Successful completion of the procedure was                            aided by straightening and shortening the scope to                            obtain bowel loop reduction. The patient tolerated                            the procedure well. The quality of the bowel  preparation was good. The ileocecal valve,                            appendiceal orifice, and rectum were photographed. Scope In: 3:27:24 PM Scope Out: 3:44:46 PM Scope Withdrawal Time: 0 hours 8 minutes 7 seconds  Total Procedure Duration: 0 hours 17 minutes 22 seconds  Findings:      A 3 mm polyp was found in the ascending colon. The polyp was sessile.       The polyp was removed with a cold snare. Resection and retrieval were       complete.      Scattered small and large-mouthed diverticula were found in the sigmoid       colon.      The presumed source of his anemia is from epistaxis. The current       findings in the colon do not explain his recent anemia. Impression:               - One 3 mm polyp in the ascending colon, removed                            with a cold snare. Resected and retrieved.                           - Diverticulosis in the sigmoid colon. Recommendation:           - Return patient to hospital ward for ongoing care.                           - Resume regular  diet.                           - Continue present medications.                           - Await pathology results.                           - Repeat colonoscopy in 7-10 years for surveillance.                           - Signing off. Procedure Code(s):        --- Professional ---                           [phone], Colonoscopy, flexible; with removal of                            tumor(s), polyp(s), or other lesion(s) by snare                            technique Diagnosis Code(s):        --- Professional ---                           K63.5, Polyp of colon  D50.9, Iron deficiency anemia, unspecified                           K57.30, Diverticulosis of large intestine without                            perforation or abscess without bleeding CPT copyright 2019 [employer]. All rights reserved. The codes documented in this report are preliminary and upon coder review may  be revised to meet current compliance requirements. Jeani Hawking, MD Jeani Hawking, MD 05/27/2020 3:53:00 PM This report has been signed electronically. Number of Addenda: 0

## 2020-05-27 NOTE — Interval H&P Note (Signed)
History and Physical Interval Note:  05/27/2020 3:06 PM  Edward Valdez  has presented today for surgery, with the diagnosis of Blood in stool/anemia.  The various methods of treatment have been discussed with the patient and family. After consideration of risks, benefits and other options for treatment, the patient has consented to  Procedure(s): COLONOSCOPY WITH PROPOFOL (N/A) as a surgical intervention.  The patient's history has been reviewed, patient examined, no change in status, stable for surgery.  I have reviewed the patient's chart and labs.  Questions were answered to the patient's satisfaction.     Wm Sahagun D

## 2020-05-27 NOTE — Anesthesia Preprocedure Evaluation (Signed)
Anesthesia Evaluation  Patient identified by MRN, date of birth, ID band Patient awake    Reviewed: Allergy & Precautions, NPO status , Patient's Chart, lab work & pertinent test results  Airway Mallampati: II  TM Distance: >3 FB     Dental  (+) Teeth Intact   Pulmonary     + decreased breath sounds      Cardiovascular hypertension,  Rhythm:Regular Rate:Normal     Neuro/Psych    GI/Hepatic   Endo/Other    Renal/GU      Musculoskeletal   Abdominal   Peds  Hematology   Anesthesia Other Findings   Reproductive/Obstetrics                             Anesthesia Physical Anesthesia Plan  ASA: III  Anesthesia Plan: MAC   Post-op Pain Management:    Induction: Intravenous  PONV Risk Score and Plan: Propofol infusion  Airway Management Planned: Natural Airway and Simple Face Mask  Additional Equipment:   Intra-op Plan:   Post-operative Plan:   Informed Consent: I have reviewed the patients History and Physical, chart, labs and discussed the procedure including the risks, benefits and alternatives for the proposed anesthesia with the patient or authorized representative who has indicated his/her understanding and acceptance.       Plan Discussed with: CRNA and Anesthesiologist  Anesthesia Plan Comments:         Anesthesia Quick Evaluation

## 2020-05-27 NOTE — Progress Notes (Signed)
Patient still quarter of prep to complete. RN  encouraged patient to finish prep for procedure today. RN assisted him to bathroom. Stool is clear, watery green/brownish color with few slobs. Notified endo of finding.

## 2020-05-27 NOTE — Transfer of Care (Signed)
Immediate Anesthesia Transfer of Care Note  Patient: Edward Valdez  Procedure(s) Performed: COLONOSCOPY WITH PROPOFOL (N/A ) POLYPECTOMY  Patient Location: Endoscopy Unit  Anesthesia Type:MAC  Level of Consciousness: sedated and patient cooperative  Airway & Oxygen Therapy: Patient Spontanous Breathing and Patient connected to face mask oxygen  Post-op Assessment: Report given to RN and Post -op Vital signs reviewed and stable  Post vital signs: Reviewed and stable  Last Vitals:  Vitals Value Taken Time  BP    Temp    Pulse    Resp    SpO2      Last Pain:  Vitals:   05/27/20 1407  TempSrc: Oral  PainSc: 0-No pain         Complications: No complications documented.

## 2020-05-27 NOTE — Anesthesia Postprocedure Evaluation (Signed)
Anesthesia Post Note  Patient: Edward Valdez  Procedure(s) Performed: ESOPHAGOGASTRODUODENOSCOPY (EGD) WITH PROPOFOL (N/A )     Patient location during evaluation: Endoscopy Anesthesia Type: MAC Level of consciousness: awake and alert Pain management: pain level controlled Vital Signs Assessment: post-procedure vital signs reviewed and stable Respiratory status: spontaneous breathing, nonlabored ventilation, respiratory function stable and patient connected to nasal cannula oxygen Cardiovascular status: stable and blood pressure returned to baseline Postop Assessment: no apparent nausea or vomiting Anesthetic complications: no   No complications documented.  Last Vitals:  Vitals:   05/27/20 0004 05/27/20 0635  BP: 114/70 123/84  Pulse: 96 96  Resp: 18 18  Temp: 36.7 C 36.7 C  SpO2: 94% 92%    Last Pain:  Vitals:   05/27/20 0635  TempSrc: Oral  PainSc:                  [place]

## 2020-05-27 NOTE — Progress Notes (Signed)
PROGRESS NOTE    Edward Valdez  YQM:[id number] DOB: 1975-11-28 DOA: 05/23/2020 PCP: [facility]   Brief Narrative:  HPI on 05/23/2020 by Dr. Shauna Hugh 45 year old male with past medical history of alcohol abuse, hypertension who presents to [facility] emergency department with dyspnea on exertion.  Patient explains that for the past 2 weeks he has been experiencing shortness of breath.  The shortness of breath was initially mild in intensity but as the days progressed shortness of breath became more and more severe.  Shortness of breath is worse with exertion and improved with rest.  Over the span of time patient is complaining of associated bilateral lower extremity edema, increasing abdominal girth, generalized malaise and weakness as well as episodes of paroxysmal nocturnal dyspnea and progressive yellowing of the skin.  Patient additionally is complaining of episodic chest tightness.  Mild to moderate in intensity, located in the midsternal region without any alleviating or exacerbating factors.  Upon further questioning patient denies fever, sick contacts, recent travel or contact with confirmed COVID-19 infection.  Because of patient's progressively worsening symptoms patient's primary care provider sent the patient to [facility] emergency department for evaluation.  Upon evaluation in the emergency department patient has found to have multiple ongoing medical issues including bilateral infiltrates on chest imaging concerning for a combination of pneumonia and pulmonary edema.  Patient also found to have substantial hyponatremia of 119 and substantial anemia with hemoglobin of 5.9.  2 units of packed red blood cells were ordered for transfusion.  Patient was administered 40 mg of intravenous Lasix.  The hospitalist group was then called to assess the patient for admission to the hospital.  Interim history  Patient admitted with shortness  of breath is with exertion and lower extremity pain as well as increased abdominal girth and general malaise, progressive yellowing of the skin.  Gastroenterology consulted and appreciated.  Pending EGD and colonoscopy. Assessment & Plan   Shortness of breath -Patient presented with several week history of progressively worsening shortness of breath on exertion and paroxysmal nocturnal dyspnea -Symptoms are multifactorial in origin including anemia (patient presented with a hemoglobin of 5.9), pneumonia, pulmonary edema -Treating underlying conditions, patient did receive blood transfusion as well as IV Lasix, antibiotics and Aldactone -Continue supplemental oxygen and wean as possible -Echocardiogram obtained showing an EF of 65 to 70%, LV has hyperdynamic function.  No regional wall motion abnormalities.  Mild LVH.  Diastolic parameters are normal  Symptomatic anemia -As above, hemoglobin was 5.9 on admission -Patient received 3 units PRBC -Hemoglobin 7.3 -Continue to monitor CBC -Anemia be secondary to myelosuppression due to heavy alcohol abuse versus possible GI bleed -Continue IV PPI twice daily -Gastroenterology consulted and appreciated -EGD showed normal appearing widely patent esophagus and GE J, no esophageal varices noted, normal-appearing stomach and duodenum. -Pending colonoscopy  Lactic acidosis -Resolved  Sepsis secondary to Pneumonia -Present on admission, patient was noted to be tachycardic as well as tachypneic with leukocytosis -Chest x-ray on admission showed a masslike opacity right upper lobe measuring 4.2 cm in projection.  Concerning for malignancy but may reflect infectious airspace disease.  Consider CT to further evaluate as well as x-ray in 6 to 8 weeks. -CTA chest showed no central pulmonary embolism.  Multifocal groundglass and consolidative opacities throughout both lungs.  No pulmonary mass noted. -Continue ceftriaxone and azithromycin  Essential  hypertension -Continue diuretics, Lasix as well as spironolactone -BP stable  Hyponatremia -Suspect secondary to beer Poto  mania/heavy alcohol abuse with volume overload -Sodium as low as 116 on admission, up to 128 today -Continue diuresis and monitor BMP  Alcohol dependence with uncomplicated withdrawal -Patient states that he drinks half of 1/5 of bourbon per day along with 8 shots minimum -Continue CIWA protocol -Cessation discussed  Alcoholic liver disease -Patient with evidence of jaundice on exam -Madreys discriminant function calculates to 16.8 which is suggestive that steroids are not necessary -Continue to monitor withdrawal -Discussed cessation  Elevated D-dimer -CTA negative -pending lower extremity doppler  Hypokalemia -Despite replacement, potassium remains 3.1, will replace with IV form -Magnesium level 1.9  -Continue to monitor CMP   DVT Prophylaxis  SCDs  Code Status: Full  Family Communication: none at bedside  Disposition Plan:  Status is: Inpatient  Remains inpatient appropriate because:IV treatments appropriate due to intensity of illness or inability to take PO and Inpatient level of care appropriate due to severity of illness   Dispo: The patient is from: Home              Anticipated d/c is to: Home              Patient currently is not medically stable to d/c.   Difficult to place patient No   Consultants Gastroenterology  Procedures  Abdominal ultrasound   Antibiotics   Anti-infectives (From admission, onward)   Start     Dose/Rate Route Frequency Ordered Stop   05/24/20 2000  cefTRIAXone (ROCEPHIN) 2 g in sodium chloride 0.9 % 100 mL IVPB        2 g 200 mL/hr over 30 Minutes Intravenous Every 24 hours 05/23/20 2321 05/29/20 1959   05/24/20 2000  azithromycin (ZITHROMAX) 500 mg in sodium chloride 0.9 % 250 mL IVPB        500 mg 250 mL/hr over 60 Minutes Intravenous Every 24 hours 05/23/20 2321 05/29/20 1959   05/23/20 2000   cefTRIAXone (ROCEPHIN) 1 g in sodium chloride 0.9 % 100 mL IVPB        1 g 200 mL/hr over 30 Minutes Intravenous  Once 05/23/20 1953 05/23/20 2118   05/23/20 2000  azithromycin (ZITHROMAX) 500 mg in sodium chloride 0.9 % 250 mL IVPB        500 mg 250 mL/hr over 60 Minutes Intravenous  Once 05/23/20 1953 05/24/20 0050      Subjective:   Sallee Provencal seen and examined today.  Continues to feel short of breath although appears to be more comfortable.  Denies current chest pain, abdominal pain, nausea or vomiting, dizziness or headache.  Tired of drinking the colonoscopy prep. Objective:   Vitals:   05/26/20 1744 05/26/20 1814 05/27/20 0004 05/27/20 0635  BP:  102/64 114/70 123/84  Pulse:  95 96 96  Resp:  16 18 18   Temp:  98.4 F (36.9 C) 98.1 F (36.7 C) 98 F (36.7 C)  TempSrc:  Oral Oral Oral  SpO2: 98% 94% 94% 92%  Weight:      Height:        Intake/Output Summary (Last 24 hours) at 05/27/2020 1115 Last data filed at 05/27/2020 0850 Gross per 24 hour  Intake 1312.52 ml  Output --  Net 1312.52 ml   Filed Weights   05/23/20 2208  Weight: 92 kg   Exam  General: Well developed, chronically ill-appearing, NAD, jaundiced  HEENT: NCAT, mucous membranes moist.   Cardiovascular: S1 S2 auscultated, RRR, no murmur  Respiratory: Diminished breath sounds  Abdomen: Soft, nontender, mildly distended, +  bowel sounds  Extremities: warm dry without cyanosis clubbing.  LE edema  Neuro: AAOx3, nonfocal  Psych: flat however appropriate   Data Reviewed: I have personally reviewed following labs and imaging studies  CBC: Recent Labs  Lab 05/24/20 0516 05/24/20 1835 05/25/20 0252 05/25/20 1541 05/26/20 0727 05/27/20 0018  WBC 19.4*  --  17.7* 19.0* 17.6* 17.7*  HGB 6.8* 7.1* 7.0* 7.0* 7.2* 7.3*  HCT 20.8* 20.9* 21.0* 22.0* 21.9* 23.5*  MCV 86.3  --  86.4 88.0 88.0 91.4  PLT 251  --  210 212 209 225   Basic Metabolic Panel: Recent Labs  Lab 05/23/20 2203  05/23/20 2245 05/24/20 0743 05/24/20 1835 05/24/20 2208 05/25/20 0252 05/25/20 1541 05/26/20 0727 05/26/20 1525 05/27/20 0018  NA  --    < > 120*   < > 123* 123* 123* 126*  --  128*  K  --    < > 3.4*   < > 3.6 3.7 3.4* 3.1*  --  3.1*  CL  --    < > 86*   < > 88* 89* 89* 91*  --  92*  CO2  --    < > 25   < > 26 25 26 27   --  27  GLUCOSE  --    < > 98   < > 98 92 105* 83  --  97  BUN  --    < > 6   < > 6 5* 6 <5*  --  <5*  CREATININE  --    < > 0.62   < > 0.70 0.64 0.67 0.66  --  0.73  CALCIUM  --    < > 8.1*   < > 7.8* 7.8* 7.8* 7.8*  --  7.8*  MG 1.9  --  1.9  --   --   --   --   --  1.9  --    < > = values in this interval not displayed.   GFR: Estimated Creatinine Clearance: 125.3 mL/min (by C-G formula based on SCr of 0.73 mg/dL). Liver Function Tests: Recent Labs  Lab 05/23/20 2203 05/24/20 0743 05/25/20 0252 05/26/20 0727 05/27/20 0018  AST 132* 130* 114* 103* 101*  ALT 17 16 15 14 16   ALKPHOS 156* 165* 145* 132* 136*  BILITOT 6.7* 7.4* 8.1* 8.5* 8.0*  PROT 7.2 7.5 7.1 6.8 7.1  ALBUMIN 2.2* 2.3* 2.1* 2.1* 2.1*   No results for input(s): LIPASE, AMYLASE in the last 168 hours. Recent Labs  Lab 05/23/20 2203  AMMONIA 51*   Coagulation Profile: Recent Labs  Lab 05/23/20 2245 05/24/20 0743  INR 1.5* 1.5*   Cardiac Enzymes: No results for input(s): CKTOTAL, CKMB, CKMBINDEX, TROPONINI in the last 168 hours. BNP (last 3 results) No results for input(s): PROBNP in the last 8760 hours. HbA1C: No results for input(s): HGBA1C in the last 72 hours. CBG: No results for input(s): GLUCAP in the last 168 hours. Lipid Profile: No results for input(s): CHOL, HDL, LDLCALC, TRIG, CHOLHDL, LDLDIRECT in the last 72 hours. Thyroid Function Tests: No results for input(s): TSH, T4TOTAL, FREET4, T3FREE, THYROIDAB in the last 72 hours. Anemia Panel: No results for input(s): VITAMINB12, FOLATE, FERRITIN, TIBC, IRON, RETICCTPCT in the last 72 hours. Urine analysis:     Component Value Date/Time   COLORURINE AMBER (A) 05/24/2020 0024   APPEARANCEUR CLEAR 05/24/2020 0024   LABSPEC 1.017 05/24/2020 0024   PHURINE 6.0 05/24/2020 0024   GLUCOSEU NEGATIVE 05/24/2020 0024  HGBUR SMALL (A) 05/24/2020 0024   BILIRUBINUR NEGATIVE 05/24/2020 0024   KETONESUR NEGATIVE 05/24/2020 0024   PROTEINUR NEGATIVE 05/24/2020 0024   NITRITE NEGATIVE 05/24/2020 0024   LEUKOCYTESUR NEGATIVE 05/24/2020 0024   Sepsis Labs: (procalcitonin:4,lacticidven:4)  ) Recent Results (from the past 240 hour(s))  Resp Panel by RT-PCR (Flu A&B, Covid) Nasopharyngeal Swab     Status: None   Collection Time: 05/23/20  5:13 PM   Specimen: Nasopharyngeal Swab; Nasopharyngeal(NP) swabs in vial transport medium  Result Value Ref Range Status   SARS Coronavirus 2 by RT PCR NEGATIVE NEGATIVE Final    Comment: (NOTE) SARS-CoV-2 target nucleic acids are NOT DETECTED.  The SARS-CoV-2 RNA is generally detectable in upper respiratory specimens during the acute phase of infection. The lowest concentration of SARS-CoV-2 viral copies this assay can detect is 138 copies/mL. A negative result does not preclude SARS-Cov-2 infection and should not be used as the sole basis for treatment or other patient management decisions. A negative result may occur with  improper specimen collection/handling, submission of specimen other than nasopharyngeal swab, presence of viral mutation(s) within the areas targeted by this assay, and inadequate number of viral copies(<138 copies/mL). A negative result must be combined with clinical observations, patient history, and epidemiological information. The expected result is Negative.  Fact Sheet for Patients:  BloggerCourse.com  Fact Sheet for Healthcare Providers:  SeriousBroker.it  This test is no t yet approved or cleared by the [place] FDA and  has been authorized for detection and/or  diagnosis of SARS-CoV-2 by FDA under an Emergency Use Authorization (EUA). This EUA will remain  in effect (meaning this test can be used) for the duration of the COVID-19 declaration under Section 564(b)(1) of the Act, 21 U.S.C.section 360bbb-3(b)(1), unless the authorization is terminated  or revoked sooner.       Influenza A by PCR NEGATIVE NEGATIVE Final   Influenza B by PCR NEGATIVE NEGATIVE Final    Comment: (NOTE) The Xpert Xpress SARS-CoV-2/FLU/RSV plus assay is intended as an aid in the diagnosis of influenza from Nasopharyngeal swab specimens and should not be used as a sole basis for treatment. Nasal washings and aspirates are unacceptable for Xpert Xpress SARS-CoV-2/FLU/RSV testing.  Fact Sheet for Patients: BloggerCourse.com  Fact Sheet for Healthcare Providers: SeriousBroker.it  This test is not yet approved or cleared by the [place] FDA and has been authorized for detection and/or diagnosis of SARS-CoV-2 by FDA under an Emergency Use Authorization (EUA). This EUA will remain in effect (meaning this test can be used) for the duration of the COVID-19 declaration under Section 564(b)(1) of the Act, 21 U.S.C. section 360bbb-3(b)(1), unless the authorization is terminated or revoked.  Performed at [facility], [address] [place], [place] [address]   Culture, blood (routine x 2)     Status: None (Preliminary result)   Collection Time: 05/24/20  5:16 AM   Specimen: BLOOD LEFT ARM  Result Value Ref Range Status   Specimen Description BLOOD LEFT ARM  Final   Special Requests   Final    BOTTLES DRAWN AEROBIC ONLY Blood Culture results may not be optimal due to an inadequate volume of blood received in culture bottles   Culture   Final    NO GROWTH 3 DAYS Performed at [facility], [address] [place], [place] [address]    Report Status PENDING  Incomplete  Culture, blood (routine x 2)      Status: None (Preliminary result)  Collection Time: 05/24/20  5:16 AM   Specimen: BLOOD RIGHT ARM  Result Value Ref Range Status   Specimen Description BLOOD RIGHT ARM  Final   Special Requests   Final    BOTTLES DRAWN AEROBIC ONLY Blood Culture results may not be optimal due to an inadequate volume of blood received in culture bottles   Culture   Final    NO GROWTH 3 DAYS Performed at [facility], [address] [place], [place] [address]    Report Status PENDING  Incomplete      Radiology Studies: No results found.   Scheduled Meds: . dextroamphetamine  20 mg Oral BID  . folic acid  1 mg Oral Daily  . furosemide  60 mg Intravenous BID  . lactulose  10 g Oral Daily  . LORazepam  0-4 mg Oral Q12H  . multivitamin with minerals  1 tablet Oral Daily  . pantoprazole (PROTONIX) IV  40 mg Intravenous Q12H  . spironolactone  25 mg Oral Daily  . thiamine  100 mg Oral Daily   Or  . thiamine  100 mg Intravenous Daily   Continuous Infusions: . azithromycin 500 mg (05/26/20 2344)  . cefTRIAXone (ROCEPHIN)  IV 2 g (05/26/20 2238)  . potassium chloride 10 mEq (05/27/20 1057)     LOS: 3 days   Time Spent in minutes   45 minutes  Chivonne Rascon D.O. on 05/27/2020 at 11:15 AM  Between 7am to 7pm - Please see pager noted on [email or web address]  After 7pm go to [email or web address]  And look for the night coverage person covering for me after hours  [facility] Office  [phone]

## 2020-05-27 NOTE — Progress Notes (Signed)
Patient returned from ENDO at this time. He denied any pain. VSS. Will continue to monitor.

## 2020-05-27 NOTE — TOC CAGE-AID Note (Signed)
Transition of Care [facility]) - CAGE-AID Screening   Patient Details  Name: Edward Valdez MRN: [id number] Date of Birth: 1975/12/01  [facility]) CM/SW Contact:    Jimmy Picket, LCSWA Phone Number: 05/27/2020, 11:57 AM   Clinical Narrative:  Pt reports he used to drink but doesn't anymore. Pt did not want to discuss his alcohol use. Pt reports occasional marijuana use. Pt stated he didn't need resources at this time.   CAGE-AID Screening:    Have You Ever Felt You Ought to Cut Down on Your Drinking or Drug Use?: Yes Have People Annoyed You By Critizing Your Drinking Or Drug Use?: No Have You Felt Bad Or Guilty About Your Drinking Or Drug Use?: No Have You Ever Had a Drink or Used Drugs First Thing In The Morning to Steady Your Nerves or to Get Rid of a Hangover?: No CAGE-AID Score: 1  Substance Abuse Education Offered: Yes     Denita Lung, Bridget Hartshorn Clinical Social Worker [phone]

## 2020-05-27 NOTE — Anesthesia Procedure Notes (Signed)
Procedure Name: MAC Date/Time: 05/27/2020 3:12 PM Performed by: Leonor Liv, CRNA Pre-anesthesia Checklist: Patient identified, Emergency Drugs available, Suction available, Patient being monitored and Timeout performed Patient Re-evaluated:Patient Re-evaluated prior to induction Oxygen Delivery Method: Simple face mask Placement Confirmation: positive ETCO2 Dental Injury: Teeth and Oropharynx as per pre-operative assessment

## 2020-05-28 ENCOUNTER — Encounter (HOSPITAL_COMMUNITY): Payer: Self-pay | Admitting: Gastroenterology

## 2020-05-28 LAB — BASIC METABOLIC PANEL
Anion gap: 8 (ref 5–15)
BUN: 5 mg/dL — ABNORMAL LOW (ref 6–20)
CO2: 26 mmol/L (ref 22–32)
Calcium: 8.1 mg/dL — ABNORMAL LOW (ref 8.9–10.3)
Chloride: 94 mmol/L — ABNORMAL LOW (ref 98–111)
Creatinine, Ser: 0.65 mg/dL (ref 0.61–1.24)
GFR, Estimated: >60 mL/min (ref >60)
Glucose, Bld: 91 mg/dL (ref 70–99)
Potassium: 2.9 mmol/L — ABNORMAL LOW (ref 3.5–5.1)
Sodium: 128 mmol/L — ABNORMAL LOW (ref 135–145)

## 2020-05-28 LAB — CBC
HCT: 23.4 % — ABNORMAL LOW (ref 39.0–52.0)
Hemoglobin: 7.2 g/dL — ABNORMAL LOW (ref 13.0–17.0)
MCH: 28.1 pg (ref 26.0–34.0)
MCHC: 30.8 g/dL (ref 30.0–36.0)
MCV: 91.4 fL (ref 80.0–100.0)
Platelets: 244 10*3/uL (ref 150–400)
RBC: 2.56 MIL/uL — ABNORMAL LOW (ref 4.22–5.81)
RDW: 19.9 % — ABNORMAL HIGH (ref 11.5–15.5)
WBC: 16.4 10*3/uL — ABNORMAL HIGH (ref 4.0–10.5)
nRBC: 0 % (ref 0.0–0.2)

## 2020-05-28 LAB — HEMOGLOBIN AND HEMATOCRIT, BLOOD
HCT: 26.9 % — ABNORMAL LOW (ref 39.0–52.0)
Hemoglobin: 8.2 g/dL — ABNORMAL LOW (ref 13.0–17.0)

## 2020-05-28 LAB — HEPATIC FUNCTION PANEL
ALT: 16 U/L (ref 0–44)
AST: 91 U/L — ABNORMAL HIGH (ref 15–41)
Albumin: 2 g/dL — ABNORMAL LOW (ref 3.5–5.0)
Alkaline Phosphatase: 146 U/L — ABNORMAL HIGH (ref 38–126)
Bilirubin, Direct: 3.4 mg/dL — ABNORMAL HIGH (ref 0.0–0.2)
Indirect Bilirubin: 2.9 mg/dL — ABNORMAL HIGH (ref 0.3–0.9)
Total Bilirubin: 6.3 mg/dL — ABNORMAL HIGH (ref 0.3–1.2)
Total Protein: 6.6 g/dL (ref 6.5–8.1)

## 2020-05-28 LAB — PREPARE RBC (CROSSMATCH)

## 2020-05-28 MED ORDER — ENSURE MAX PROTEIN PO LIQD
11.0000 [oz_av] | Freq: Every day | ORAL | Status: DC
  Administered 2020-05-28: 11 [oz_av] via ORAL
  Filled 2020-05-28 (×2): qty 330

## 2020-05-28 MED ORDER — POTASSIUM CHLORIDE CRYS ER 20 MEQ PO TBCR
40.0000 meq | EXTENDED_RELEASE_TABLET | ORAL | Status: AC
  Administered 2020-05-28 (×3): 40 meq via ORAL
  Filled 2020-05-28 (×3): qty 2

## 2020-05-28 MED ORDER — SODIUM CHLORIDE 0.9% IV SOLUTION: Freq: Once | INTRAVENOUS | Status: AC

## 2020-05-28 NOTE — Progress Notes (Signed)
PROGRESS NOTE    Edward Valdez  UJW:[id number] DOB: [birth year] DOA: 05/23/2020 PCP: [facility]   Brief Narrative:  HPI on 05/23/2020 by Dr. Shauna Hugh 45 year old male with past medical history of alcohol abuse, hypertension who presents to [facility] emergency department with dyspnea on exertion.  Patient explains that for the past 2 weeks he has been experiencing shortness of breath.  The shortness of breath was initially mild in intensity but as the days progressed shortness of breath became more and more severe.  Shortness of breath is worse with exertion and improved with rest.  Over the span of time patient is complaining of associated bilateral lower extremity edema, increasing abdominal girth, generalized malaise and weakness as well as episodes of paroxysmal nocturnal dyspnea and progressive yellowing of the skin.  Patient additionally is complaining of episodic chest tightness.  Mild to moderate in intensity, located in the midsternal region without any alleviating or exacerbating factors.  Upon further questioning patient denies fever, sick contacts, recent travel or contact with confirmed COVID-19 infection.  Because of patient's progressively worsening symptoms patient's primary care provider sent the patient to [facility] emergency department for evaluation.  Upon evaluation in the emergency department patient has found to have multiple ongoing medical issues including bilateral infiltrates on chest imaging concerning for a combination of pneumonia and pulmonary edema.  Patient also found to have substantial hyponatremia of 119 and substantial anemia with hemoglobin of 5.9.  2 units of packed red blood cells were ordered for transfusion.  Patient was administered 40 mg of intravenous Lasix.  The hospitalist group was then called to assess the patient for admission to the hospital.  Interim history  Patient admitted with shortness  of breath is with exertion and lower extremity pain as well as increased abdominal girth and general malaise, progressive yellowing of the skin.  Gastroenterology consulted and appreciated.  S/p EGD and colonoscopy. Assessment & Plan   Shortness of breath -Patient presented with several week history of progressively worsening shortness of breath on exertion and paroxysmal nocturnal dyspnea -Symptoms are multifactorial in origin including anemia (patient presented with a hemoglobin of 5.9), pneumonia, pulmonary edema -Treating underlying conditions, patient did receive blood transfusion as well as IV Lasix, antibiotics and Aldactone -Continue supplemental oxygen and wean as possible -Echocardiogram obtained showing an EF of 65 to 70%, LV has hyperdynamic function.  No regional wall motion abnormalities.  Mild LVH.  Diastolic parameters are normal  Symptomatic anemia-possibly secondary to epistaxis (PTA) -As above, hemoglobin was 5.9 on admission -Patient received 3 units PRBC -Hemoglobin 7.2 -Anemia be secondary to myelosuppression due to heavy alcohol abuse versus possible GI bleed -Continue IV PPI twice daily -Gastroenterology consulted and appreciated -EGD showed normal appearing widely patent esophagus and GE J, no esophageal varices noted, normal-appearing stomach and duodenum. -Colonoscopy showed a 3 mm polyp in the ascending colon which was removed with a cold snare.  Presumed source of anemia is from epistaxis.  Diverticulosis in the sigmoid colon. -Patient tells me that he has been following with Dr. Suszanne Conners, ENT, for his epistaxis -Continues to be very short of breath and feels extremely weak -Will transfuse 1 more unit PRBC -Continue to monitor CBC  Lactic acidosis -Resolved  Sepsis secondary to Pneumonia -Present on admission, patient was noted to be tachycardic as well as tachypneic with leukocytosis -Chest x-ray on admission showed a masslike opacity right upper lobe measuring  4.2 cm in projection.  Concerning for malignancy  but may reflect infectious airspace disease.  Consider CT to further evaluate as well as x-ray in 6 to 8 weeks. -CTA chest showed no central pulmonary embolism.  Multifocal groundglass and consolidative opacities throughout both lungs.  No pulmonary mass noted. -Continue ceftriaxone and azithromycin  Essential hypertension -Continue diuretics, Lasix as well as spironolactone -BP stable  Hyponatremia -Suspect secondary to beer Poto mania/heavy alcohol abuse with volume overload -Sodium as low as 116 on admission, up to 128 today -Continue diuresis and monitor BMP  Alcohol dependence with uncomplicated withdrawal -Patient states that he drinks half of 1/5 of bourbon per day along with 8 shots minimum -Continue CIWA protocol -Cessation discussed  Alcoholic liver disease with anasarca -Patient with evidence of jaundice on exam -Madreys discriminant function calculates to 16.8 which is suggestive that steroids are not necessary -Continue to monitor withdrawal -Continue Lasix, spironolactone -Discussed cessation  Elevated D-dimer -CTA negative -Lower extremity doppler done on 05/21/2020 (prior to admission) was unremarkable for DVT  Hypokalemia -Despite replacement, potassium remains 2.9 -Will continue to replace  -Magnesium level 1.9  -Continue to monitor CMP  Poor nutritional status -Patient with albumin of 2 -Nutrition consulted and appreciated   DVT Prophylaxis  SCDs  Code Status: Full  Family Communication: none at bedside.  Wife via phone, all questions answered  Disposition Plan:  Status is: Inpatient  Remains inpatient appropriate because:IV treatments appropriate due to intensity of illness or inability to take PO and Inpatient level of care appropriate due to severity of illness   Dispo: The patient is from: Home              Anticipated d/c is to: Home              Patient currently is not medically stable to  d/c.   Difficult to place patient No   Consultants Gastroenterology  Procedures  Abdominal ultrasound   Antibiotics   Anti-infectives (From admission, onward)   Start     Dose/Rate Route Frequency Ordered Stop   05/24/20 2000  cefTRIAXone (ROCEPHIN) 2 g in sodium chloride 0.9 % 100 mL IVPB        2 g 200 mL/hr over 30 Minutes Intravenous Every 24 hours 05/23/20 2321 05/29/20 1959   05/24/20 2000  azithromycin (ZITHROMAX) 500 mg in sodium chloride 0.9 % 250 mL IVPB        500 mg 250 mL/hr over 60 Minutes Intravenous Every 24 hours 05/23/20 2321 05/29/20 1959   05/23/20 2000  cefTRIAXone (ROCEPHIN) 1 g in sodium chloride 0.9 % 100 mL IVPB        1 g 200 mL/hr over 30 Minutes Intravenous  Once 05/23/20 1953 05/23/20 2118   05/23/20 2000  azithromycin (ZITHROMAX) 500 mg in sodium chloride 0.9 % 250 mL IVPB        500 mg 250 mL/hr over 60 Minutes Intravenous  Once 05/23/20 1953 05/24/20 0050      Subjective:   Edward Valdez seen and examined today.  Use to feel short of breath.  Denies current chest pain, abdominal pain, nausea or vomiting, diarrhea or constipation, dizziness or headache.   Objective:   Vitals:   05/27/20 1618 05/27/20 1707 05/28/20 0049 05/28/20 0507  BP: 115/70 105/60 122/74 112/76  Pulse: 87 87 92 94  Resp: 19 18 18 18   Temp:  98.1 F (36.7 C) 98.5 F (36.9 C) 98.2 F (36.8 C)  TempSrc:  Oral Oral Oral  SpO2: 92% 93% 94% 94%  Weight:  Height:        Intake/Output Summary (Last 24 hours) at 05/28/2020 1118 Last data filed at 05/27/2020 2040 Gross per 24 hour  Intake 760 ml  Output --  Net 760 ml   Filed Weights   05/23/20 2208  Weight: 92 kg   Exam  General: Well developed, chronically ill-appearing, NAD, jaundiced  HEENT: NCAT, mucous membranes moist.   Cardiovascular: S1 S2 auscultated, RRR  Respiratory: Clear to auscultation bilaterally with equal chest rise  Abdomen: Soft, nontender, nondistended, + bowel sounds  Extremities:  warm dry without cyanosis clubbing.  LE edema  Neuro: AAOx3, nonfocal  Psych: appropriate mood and affect  Data Reviewed: I have personally reviewed following labs and imaging studies  CBC: Recent Labs  Lab 05/25/20 0252 05/25/20 1541 05/26/20 0727 05/27/20 0018 05/28/20 0454  WBC 17.7* 19.0* 17.6* 17.7* 16.4*  HGB 7.0* 7.0* 7.2* 7.3* 7.2*  HCT 21.0* 22.0* 21.9* 23.5* 23.4*  MCV 86.4 88.0 88.0 91.4 91.4  PLT 210 212 209 225 244   Basic Metabolic Panel: Recent Labs  Lab 05/23/20 2203 05/23/20 2245 05/24/20 0743 05/24/20 1835 05/25/20 0252 05/25/20 1541 05/26/20 0727 05/26/20 1525 05/27/20 0018 05/28/20 0454  NA  --    < > 120*   < > 123* 123* 126*  --  128* 128*  K  --    < > 3.4*   < > 3.7 3.4* 3.1*  --  3.1* 2.9*  CL  --    < > 86*   < > 89* 89* 91*  --  92* 94*  CO2  --    < > 25   < > 25 26 27   --  27 26  GLUCOSE  --    < > 98   < > 92 105* 83  --  97 91  BUN  --    < > 6   < > 5* 6 <5*  --  <5* 5*  CREATININE  --    < > 0.62   < > 0.64 0.67 0.66  --  0.73 0.65  CALCIUM  --    < > 8.1*   < > 7.8* 7.8* 7.8*  --  7.8* 8.1*  MG 1.9  --  1.9  --   --   --   --  1.9  --   --    < > = values in this interval not displayed.   GFR: Estimated Creatinine Clearance: 125.3 mL/min (by C-G formula based on SCr of 0.65 mg/dL). Liver Function Tests: Recent Labs  Lab 05/24/20 0743 05/25/20 0252 05/26/20 0727 05/27/20 0018 05/28/20 0454  AST 130* 114* 103* 101* 91*  ALT 16 15 14 16 16   ALKPHOS 165* 145* 132* 136* 146*  BILITOT 7.4* 8.1* 8.5* 8.0* 6.3*  PROT 7.5 7.1 6.8 7.1 6.6  ALBUMIN 2.3* 2.1* 2.1* 2.1* 2.0*   No results for input(s): LIPASE, AMYLASE in the last 168 hours. Recent Labs  Lab 05/23/20 2203  AMMONIA 51*   Coagulation Profile: Recent Labs  Lab 05/23/20 2245 05/24/20 0743  INR 1.5* 1.5*   Cardiac Enzymes: No results for input(s): CKTOTAL, CKMB, CKMBINDEX, TROPONINI in the last 168 hours. BNP (last 3 results) No results for input(s): PROBNP  in the last 8760 hours. HbA1C: No results for input(s): HGBA1C in the last 72 hours. CBG: No results for input(s): GLUCAP in the last 168 hours. Lipid Profile: No results for input(s): CHOL, HDL, LDLCALC, TRIG, CHOLHDL, LDLDIRECT in the last  72 hours. Thyroid Function Tests: No results for input(s): TSH, T4TOTAL, FREET4, T3FREE, THYROIDAB in the last 72 hours. Anemia Panel: No results for input(s): VITAMINB12, FOLATE, FERRITIN, TIBC, IRON, RETICCTPCT in the last 72 hours. Urine analysis:    Component Value Date/Time   COLORURINE AMBER (A) 05/24/2020 0024   APPEARANCEUR CLEAR 05/24/2020 0024   LABSPEC 1.017 05/24/2020 0024   PHURINE 6.0 05/24/2020 0024   GLUCOSEU NEGATIVE 05/24/2020 0024   HGBUR SMALL (A) 05/24/2020 0024   BILIRUBINUR NEGATIVE 05/24/2020 0024   KETONESUR NEGATIVE 05/24/2020 0024   PROTEINUR NEGATIVE 05/24/2020 0024   NITRITE NEGATIVE 05/24/2020 0024   LEUKOCYTESUR NEGATIVE 05/24/2020 0024   Sepsis Labs: @LABRCNTIP (procalcitonin:4,lacticidven:4)  ) Recent Results (from the past 240 hour(s))  Resp Panel by RT-PCR (Flu A&B, Covid) Nasopharyngeal Swab     Status: None   Collection Time: 05/23/20  5:13 PM   Specimen: Nasopharyngeal Swab; Nasopharyngeal(NP) swabs in vial transport medium  Result Value Ref Range Status   SARS Coronavirus 2 by RT PCR NEGATIVE NEGATIVE Final    Comment: (NOTE) SARS-CoV-2 target nucleic acids are NOT DETECTED.  The SARS-CoV-2 RNA is generally detectable in upper respiratory specimens during the acute phase of infection. The lowest concentration of SARS-CoV-2 viral copies this assay can detect is 138 copies/mL. A negative result does not preclude SARS-Cov-2 infection and should not be used as the sole basis for treatment or other patient management decisions. A negative result may occur with  improper specimen collection/handling, submission of specimen other than nasopharyngeal swab, presence of viral mutation(s) within the areas  targeted by this assay, and inadequate number of viral copies(<138 copies/mL). A negative result must be combined with clinical observations, patient history, and epidemiological information. The expected result is Negative.  Fact Sheet for Patients:  BloggerCourse.comhttps://www.fda.gov/media/152166/download  Fact Sheet for Healthcare Providers:  SeriousBroker.ithttps://www.fda.gov/media/152162/download  This test is no t yet approved or cleared by the [place] FDA and  has been authorized for detection and/or diagnosis of SARS-CoV-2 by FDA under an Emergency Use Authorization (EUA). This EUA will remain  in effect (meaning this test can be used) for the duration of the COVID-19 declaration under Section 564(b)(1) of the Act, 21 U.S.C.section 360bbb-3(b)(1), unless the authorization is terminated  or revoked sooner.       Influenza A by PCR NEGATIVE NEGATIVE Final   Influenza B by PCR NEGATIVE NEGATIVE Final    Comment: (NOTE) The Xpert Xpress SARS-CoV-2/FLU/RSV plus assay is intended as an aid in the diagnosis of influenza from Nasopharyngeal swab specimens and should not be used as a sole basis for treatment. Nasal washings and aspirates are unacceptable for Xpert Xpress SARS-CoV-2/FLU/RSV testing.  Fact Sheet for Patients: BloggerCourse.comhttps://www.fda.gov/media/152166/download  Fact Sheet for Healthcare Providers: SeriousBroker.ithttps://www.fda.gov/media/152162/download  This test is not yet approved or cleared by the [place] FDA and has been authorized for detection and/or diagnosis of SARS-CoV-2 by FDA under an Emergency Use Authorization (EUA). This EUA will remain in effect (meaning this test can be used) for the duration of the COVID-19 declaration under Section 564(b)(1) of the Act, 21 U.S.C. section 360bbb-3(b)(1), unless the authorization is terminated or revoked.  Performed at [facility], [address] [place], [place] [address]   Culture, blood (routine x 2)     Status: None (Preliminary result)    Collection Time: 05/24/20  5:16 AM   Specimen: BLOOD LEFT ARM  Result Value Ref Range Status   Specimen Description BLOOD LEFT ARM  Final   Special Requests  Final    BOTTLES DRAWN AEROBIC ONLY Blood Culture results may not be optimal due to an inadequate volume of blood received in culture bottles   Culture   Final    NO GROWTH 3 DAYS Performed at [facility], [address], [place], [place] [address]    Report Status PENDING  Incomplete  Culture, blood (routine x 2)     Status: None (Preliminary result)   Collection Time: 05/24/20  5:16 AM   Specimen: BLOOD RIGHT ARM  Result Value Ref Range Status   Specimen Description BLOOD RIGHT ARM  Final   Special Requests   Final    BOTTLES DRAWN AEROBIC ONLY Blood Culture results may not be optimal due to an inadequate volume of blood received in culture bottles   Culture   Final    NO GROWTH 3 DAYS Performed at [facility], [address], [place], [place] [address]    Report Status PENDING  Incomplete      Radiology Studies: No results found.   Scheduled Meds: . dextroamphetamine  20 mg Oral BID  . folic acid  1 mg Oral Daily  . furosemide  60 mg Intravenous BID  . lactulose  10 g Oral Daily  . multivitamin with minerals  1 tablet Oral Daily  . pantoprazole (PROTONIX) IV  40 mg Intravenous Q12H  . potassium chloride  40 mEq Oral Q4H  . spironolactone  25 mg Oral Daily  . thiamine  100 mg Oral Daily   Or  . thiamine  100 mg Intravenous Daily   Continuous Infusions: . sodium chloride    . sodium chloride    . azithromycin 500 mg (05/27/20 7591)  . cefTRIAXone (ROCEPHIN)  IV 2 g (05/27/20 1956)     LOS: 4 days   Time Spent in minutes   45 minutes  Yaiza Palazzola D.O. on 05/28/2020 at 11:18 AM  Between 7am to 7pm - Please see pager noted on [email or web address]  After 7pm go to [email or web address]  And look for the night coverage person covering for me after hours  [facility] Office  [phone]

## 2020-05-28 NOTE — Progress Notes (Signed)
Initial Nutrition Assessment  DOCUMENTATION CODES:   Not applicable  INTERVENTION:    Ensure Max po BID, each supplement provides 150 kcal and 30 grams of protein.   MVI with minerals daily.  NUTRITION DIAGNOSIS:   Increased nutrient needs related to chronic illness (alcoholic liver disease) as evidenced by estimated needs.  GOAL:   Patient will meet greater than or equal to 90% of their needs  MONITOR:   PO intake,Supplement acceptance,Labs  REASON FOR ASSESSMENT:   Consult Assessment of nutrition requirement/status  ASSESSMENT:   45 yo male admitted with SOB, symptomatic anemia, PNA. PMH includes alcohol abuse, HTN, CHF, ADD.   Patient with anasarca r/t alcoholic liver disease. Hypokalemia ongoing, receiving K supplementation.  S/P colonoscopy 3/31, polyp removed, diverticulosis found in sigmoid colon.  Epistaxis is suspected to be the cause of anemia.  Patient reports that has has been eating poorly d/t a poor appetite and difficulty breathing for the past 3 weeks. His appetite has improved since yesterday and he is eating a little better. He ate ~70% of breakfast today. Activity has been reduced since SOB started 3 weeks ago. He has been craving leafy greens; has not wanted to eat a lot of meat recently. He agreed to try Ensure supplements to increase protein intake. He reports that he used to weigh ~240 lbs (unsure when), currently 202 lbs.   Labs reviewed. Na 128, K 2.9  Medications reviewed and include folic acid, lasix, lactulose, MVI with minerals, KCl, Spironolactone, thiamine.  Patient is at increased nutrition risk, given moderate fat depletion x 1 area and mild muscle depletion x 1 area, history of alcohol abuse, recent poor intake with reported weight loss.    NUTRITION - FOCUSED PHYSICAL EXAM:  Flowsheet Row Most Recent Value  Orbital Region No depletion  Upper Arm Region Moderate depletion  Thoracic and Lumbar Region No depletion  Buccal Region  No depletion  Temple Region No depletion  Clavicle Bone Region No depletion  Clavicle and Acromion Bone Region No depletion  Scapular Bone Region No depletion  Dorsal Hand Mild depletion  Patellar Region No depletion  Anterior Thigh Region No depletion  Posterior Calf Region No depletion  Edema (RD Assessment) Mild  Hair Reviewed  Eyes Reviewed  Mouth Reviewed  Skin Reviewed  Nails Reviewed       Diet Order:   Diet Order            Diet regular Room service appropriate? Yes; Fluid consistency: Thin  Diet effective now                 EDUCATION NEEDS:   Education needs have been addressed  Skin:  Skin Assessment: Reviewed RN Assessment  Last BM:  3/31  Height:   Ht Readings from Last 1 Encounters:  05/23/20 6' (1.829 m)    Weight:   Wt Readings from Last 1 Encounters:  05/23/20 92 kg    Ideal Body Weight:  80.9 kg  BMI:  Body mass index is 27.51 kg/m.  Estimated Nutritional Needs:   Kcal:  2100-2300  Protein:  100-120 gm  Fluid:  2 L    Gabriel Rainwater, RD, LDN, CNSC Please refer to Amion for contact information.

## 2020-05-29 DIAGNOSIS — K922 Gastrointestinal hemorrhage, unspecified: Secondary | ICD-10-CM

## 2020-05-29 LAB — TYPE AND SCREEN
ABO/RH(D): A POS
Antibody Screen: NEGATIVE
Unit division: 0

## 2020-05-29 LAB — CULTURE, BLOOD (ROUTINE X 2)
Culture: NO GROWTH
Culture: NO GROWTH

## 2020-05-29 LAB — COMPREHENSIVE METABOLIC PANEL
ALT: 16 U/L (ref 0–44)
AST: 88 U/L — ABNORMAL HIGH (ref 15–41)
Albumin: 1.9 g/dL — ABNORMAL LOW (ref 3.5–5.0)
Alkaline Phosphatase: 119 U/L (ref 38–126)
Anion gap: 9 (ref 5–15)
BUN: 5 mg/dL — ABNORMAL LOW (ref 6–20)
CO2: 25 mmol/L (ref 22–32)
Calcium: 7.7 mg/dL — ABNORMAL LOW (ref 8.9–10.3)
Chloride: 94 mmol/L — ABNORMAL LOW (ref 98–111)
Creatinine, Ser: 0.63 mg/dL (ref 0.61–1.24)
GFR, Estimated: >60 mL/min (ref >60)
Glucose, Bld: 103 mg/dL — ABNORMAL HIGH (ref 70–99)
Potassium: 3.2 mmol/L — ABNORMAL LOW (ref 3.5–5.1)
Sodium: 128 mmol/L — ABNORMAL LOW (ref 135–145)
Total Bilirubin: 7.3 mg/dL — ABNORMAL HIGH (ref 0.3–1.2)
Total Protein: 6.5 g/dL (ref 6.5–8.1)

## 2020-05-29 LAB — HEMOGLOBIN AND HEMATOCRIT, BLOOD
HCT: 25.2 % — ABNORMAL LOW (ref 39.0–52.0)
Hemoglobin: 7.8 g/dL — ABNORMAL LOW (ref 13.0–17.0)

## 2020-05-29 LAB — BPAM RBC
Blood Product Expiration Date: 202204252359
ISSUE DATE / TIME: 202204011203
Unit Type and Rh: 6200

## 2020-05-29 MED ORDER — FUROSEMIDE 20 MG PO TABS: 20.0000 mg | ORAL_TABLET | Freq: Every day | ORAL | 0 refills | Status: AC

## 2020-05-29 MED ORDER — LORAZEPAM 0.5 MG PO TABS: 0.5000 mg | ORAL_TABLET | Freq: Two times a day (BID) | ORAL | 0 refills | Status: AC | PRN

## 2020-05-29 MED ORDER — ENSURE MAX PROTEIN PO LIQD: 11.0000 [oz_av] | Freq: Every day | ORAL | Status: AC

## 2020-05-29 MED ORDER — PANTOPRAZOLE SODIUM 40 MG PO TBEC: 40.0000 mg | DELAYED_RELEASE_TABLET | Freq: Every day | ORAL | 1 refills | Status: AC

## 2020-05-29 MED ORDER — SPIRONOLACTONE 100 MG PO TABS
50.0000 mg | ORAL_TABLET | Freq: Every day | ORAL | Status: AC
Start: 2020-05-29 — End: ?

## 2020-05-29 MED ORDER — ADULT MULTIVITAMIN W/MINERALS CH: 1.0000 | ORAL_TABLET | Freq: Every day | ORAL | Status: AC

## 2020-05-29 MED ORDER — POTASSIUM CHLORIDE CRYS ER 20 MEQ PO TBCR
40.0000 meq | EXTENDED_RELEASE_TABLET | ORAL | Status: DC
  Administered 2020-05-29: 40 meq via ORAL
  Filled 2020-05-29: qty 2

## 2020-05-29 MED ORDER — LACTULOSE 10 GM/15ML PO SOLN: 10.0000 g | Freq: Every day | ORAL | 0 refills | Status: AC

## 2020-05-29 NOTE — Discharge Instructions (Signed)
Alcohol Abuse and Nutrition Alcohol abuse is any pattern of alcohol consumption that harms your health, relationships, or work. Alcohol abuse can cause poor nutrition (malnutrition or malnourishment) and a lack of nutrients (nutrient deficiencies), which can lead to more complications. Alcohol abuse brings malnutrition and nutrient deficiencies in two ways:  It causes your liver to work abnormally. This affects how your body divides (breaks down) and absorbs nutrients from food.  It causes you to eat poorly. Many people who abuse alcohol do not eat enough carbohydrates, protein, fat, vitamins, and minerals. Nutrients that are commonly lacking (deficient) in people who abuse alcohol include:  Vitamins. ? Vitamin A. This is needed for your vision, metabolism, and ability to fight off infections (immunity). ? B vitamins. These include folate, thiamine, and niacin. These are needed for new cell growth. ? Vitamin C. This plays an important role in wound healing, immunity, and helping your body to absorb iron. ? Vitamin D. This is necessary for your body to absorb and use calcium. It is produced by your liver, but you can also get it from food and from sun exposure.  Minerals. ? Calcium. This is needed for healthy bones as well as heart and blood vessel (cardiovascular) function. ? Iron. This is important for blood, muscle, and nervous system functioning. ? Magnesium. This plays an important role in muscle and nerve function, and it helps to control blood sugar and blood pressure. ? Zinc. This is important for the normal functioning of your nervous system and digestive system (gastrointestinal tract). If you think that you have an alcohol dependency problem, or if it is hard to stop drinking because you feel sick or different when you do not use alcohol, talk with your health care provider or another health professional about where to get help. Nutrition is an essential factor in therapy for alcohol  abuse. Your health care provider or diet and nutrition specialist (dietitian) will work with you to design a plan that can help to restore nutrients to your body and prevent the risk of complications. What is my plan? Your dietitian may develop a specific eating plan that is based on your condition and any other problems that you have. An eating plan will commonly include:  A balanced diet. ? Grains: 6-8 oz (170-227 g) a day. Examples of 1 oz of whole grains include 1 cup of whole-wheat cereal,  cup of brown rice, or 1 slice of whole-wheat bread. ? Vegetables: 2-3 cups a day. Examples of 1 cup of vegetables include 2 medium carrots, 1 large tomato, or 2 stalks of celery. ? Fruits: 1-2 cups a day. Examples of 1 cup of fruit include 1 large banana, 1 small apple, 8 large strawberries, or 1 large orange. ? Meat and other protein: 5-6 oz (142-170 g) a day.  A cut of meat or fish that is the size of a deck of cards is about 3-4 oz.  Foods that provide 1 oz of protein include 1 egg,  cup of nuts or seeds, or 1 tablespoon (16 g) of peanut butter. ? Dairy: 2-3 cups a day. Examples of 1 cup of dairy include 8 oz (230 mL) of milk, 8 oz (230 g) of yogurt, or 1 oz (44 g) of natural cheese.  Vitamin and mineral supplements.   What are tips for following this plan?  Eat frequent meals and snacks. Try to eat 5-6 small meals each day.  Take vitamin or mineral supplements as recommended by your dietitian.  If you  are malnourished or if your dietitian recommends it: ? You may follow a high-protein, high-calorie diet. This may include:  2,000-3,000 calories (kilocalories) a day.  70-100 g (grams) of protein a day. ? You may be directed to follow a diet that includes a complete nutritional supplement beverage. This can help to restore calories, protein, and vitamins to your body. Depending on your condition, you may be advised to consume this beverage instead of your meals or in addition to  them.  Certain medicines may cause changes in your appetite, taste, and weight. Work with your health care provider and dietitian to make any changes to your medicines and eating plan.  If you are unable to take in enough food and calories by mouth, your health care provider may recommend a feeding tube. This tube delivers nutritional supplements directly to your stomach. Recommended foods  Eat foods that are high in molecules that prevent oxygen from reacting with your food (antioxidants). These foods include grapes, berries, nuts, green tea, and dark green or orange vegetables. Eating these can help to prevent some of the stress that is placed on your liver by consuming alcohol.  Eat a variety of fresh fruits and vegetables each day. This will help you to get fiber and vitamins in your diet.  Drink plenty of water and other clear fluids, such as apple juice and broth. Try to drink at least 48-64 oz (1.5-2 L) of water a day.  Include foods fortified with vitamins and minerals in your diet. Commonly fortified foods include milk, orange juice, cereal, and bread.  Eat a variety of foods that are high in omega-3 and omega-6 fatty acids. These include fish, nuts and seeds, and soybeans. These foods may help your liver to recover and may also stabilize your mood.  If you are a vegetarian: ? Eat a variety of protein-rich foods. ? Pair whole grains with plant-based proteins at meals and snack time. For example, eat rice with beans, put peanut butter on whole-grain toast, or eat oatmeal with sunflower seeds. The items listed above may not be a complete list of foods and beverages you can eat. Contact a dietitian for more information. Foods to avoid  Avoid foods and drinks that are high in fat and sugar. Sugary drinks, salty snacks, and candy contain empty calories. This means that they lack important nutrients such as protein, fiber, and vitamins.  Avoid alcohol. This is the best way to avoid  malnutrition due to alcohol abuse. If you must drink, drink measured amounts. Measured drinking means limiting your intake to no more than 1 drink a day for nonpregnant women and 2 drinks a day for men. One drink equals 12 oz (355 mL) of beer, 5 oz (148 mL) of wine, or 1 oz (44 mL) of hard liquor.  Limit your intake of caffeine. Replace drinks like coffee and black tea with decaffeinated coffee and decaffeinated herbal tea. The items listed above may not be a complete list of foods and beverages you should avoid. Contact a dietitian for more information. Summary  Alcohol abuse can cause poor nutrition (malnutrition or malnourishment) and a lack of nutrients (nutrient deficiencies), which can lead to more health problems.  Common nutrient deficiencies include vitamin deficiencies (A, B, C, and D) and mineral deficiencies (calcium, iron, magnesium, and zinc).  Nutrition is an essential factor in therapy for alcohol abuse.  Your health care provider and dietitian can help you to develop a specific eating plan that includes a balanced diet plus  vitamin and mineral supplements. This information is not intended to replace advice given to you by your health care provider. Make sure you discuss any questions you have with your health care provider. Document Revised: 06/04/2018 Document Reviewed: 10/31/2016 Elsevier Patient Education  2021 [place]. Fatty Liver Disease  The liver converts food into energy, removes toxic material from the blood, makes important proteins, and absorbs necessary vitamins from food. Fatty liver disease occurs when too much fat has built up in your liver cells. Fatty liver disease is also called hepatic steatosis. In many cases, fatty liver disease does not cause symptoms or problems. It is often diagnosed when tests are being done for other reasons. However, over time, fatty liver can cause inflammation that may lead to more serious liver problems, such as scarring of the  liver (cirrhosis) and liver failure. Fatty liver is associated with insulin resistance, increased body fat, high blood pressure (hypertension), and high cholesterol. These are features of metabolic syndrome and increase your risk for stroke, diabetes, and heart disease. What are the causes? This condition may be caused by components of metabolic syndrome:  Obesity.  Insulin resistance.  High cholesterol. Other causes:  Alcohol abuse.  Poor nutrition.  Cushing syndrome.  Pregnancy.  Certain drugs.  Poisons.  Some viral infections. What increases the risk? You are more likely to develop this condition if you:  Abuse alcohol.  Are overweight.  Have diabetes.  Have hepatitis.  Have a high triglyceride level.  Are pregnant. What are the signs or symptoms? Fatty liver disease often does not cause symptoms. If symptoms do develop, they can include:  Fatigue and weakness.  Weight loss.  Confusion.  Nausea, vomiting, or abdominal pain.  Yellowing of your skin and the white parts of your eyes (jaundice).  Itchy skin. How is this diagnosed? This condition may be diagnosed by:  A physical exam and your medical history.  Blood tests.  Imaging tests, such as an ultrasound, CT scan, or MRI.  A liver biopsy. A small sample of liver tissue is removed using a needle. The sample is then looked at under a microscope. How is this treated? Fatty liver disease is often caused by other health conditions. Treatment for fatty liver may involve medicines and lifestyle changes to manage conditions such as:  Alcoholism.  High cholesterol.  Diabetes.  Being overweight or obese. Follow these instructions at home:  Do not drink alcohol. If you have trouble quitting, ask your health care provider how to safely quit with the help of medicine or a supervised program. This is important to keep your condition from getting worse.  Eat a healthy diet as told by your health care  provider. Ask your health care provider about working with a dietitian to develop an eating plan.  Exercise regularly. This can help you lose weight and control your cholesterol and diabetes. Talk to your health care provider about an exercise plan and which activities are best for you.  Take over-the-counter and prescription medicines only as told by your health care provider.  Keep all follow-up visits. This is important.   Contact a health care provider if:  You have trouble controlling your: ? Blood sugar. This is especially important if you have diabetes. ? Cholesterol. ? Drinking of alcohol. Get help right away if:  You have abdominal pain.  You have jaundice.  You have nausea and are vomiting.  You vomit blood or material that looks like coffee grounds.  You have stools that are  black, tar-like, or bloody. Summary  Fatty liver disease develops when too much fat builds up in the cells of your liver.  Fatty liver disease often causes no symptoms or problems. However, over time, fatty liver can cause inflammation that may lead to more serious liver problems, such as scarring of the liver (cirrhosis).  You are more likely to develop this condition if you abuse alcohol, are pregnant, are overweight, have diabetes, have hepatitis, or have high triglyceride or cholesterol levels.  Contact your health care provider if you have trouble controlling your blood sugar, cholesterol, or drinking of alcohol. This information is not intended to replace advice given to you by your health care provider. Make sure you discuss any questions you have with your health care provider. Document Revised: 11/27/2019 Document Reviewed: 11/27/2019 Elsevier Patient Education  [place]. [employer] medicine (25th ed., pp. [phone]). [place], [place]: Elsevier.">  Anemia  Anemia is a condition in which there is not enough red blood cells or hemoglobin in the blood. Hemoglobin is a substance  in red blood cells that carries oxygen. When you do not have enough red blood cells or hemoglobin (are anemic), your body cannot get enough oxygen and your organs may not work properly. As a result, you may feel very tired or have other problems. What are the causes? Common causes of anemia include:  Excessive bleeding. Anemia can be caused by excessive bleeding inside or outside the body, including bleeding from the intestines or from heavy menstrual periods in females.  Poor nutrition.  Long-lasting (chronic) kidney, thyroid, and liver disease.  Bone marrow disorders, spleen problems, and blood disorders.  Cancer and treatments for cancer.  HIV (human immunodeficiency virus) and AIDS (acquired immunodeficiency syndrome).  Infections, medicines, and autoimmune disorders that destroy red blood cells. What are the signs or symptoms? Symptoms of this condition include:  Minor weakness.  Dizziness.  Headache, or difficulties concentrating and sleeping.  Heartbeats that feel irregular or faster than normal (palpitations).  Shortness of breath, especially with exercise.  Pale skin, lips, and nails, or cold hands and feet.  Indigestion and nausea. Symptoms may occur suddenly or develop slowly. If your anemia is mild, you may not have symptoms. How is this diagnosed? This condition is diagnosed based on blood tests, your medical history, and a physical exam. In some cases, a test may be needed in which cells are removed from the soft tissue inside of a bone and looked at under a microscope (bone marrow biopsy). Your health care provider may also check your stool (feces) for blood and may do additional testing to look for the cause of your bleeding. Other tests may include:  Imaging tests, such as a CT scan or MRI.  A procedure to see inside your esophagus and stomach (endoscopy).  A procedure to see inside your colon and rectum (colonoscopy). How is this treated? Treatment for  this condition depends on the cause. If you continue to lose a lot of blood, you may need to be treated at a hospital. Treatment may include:  Taking supplements of iron, vitamin M57, or folic acid.  Taking a hormone medicine (erythropoietin) that can help to stimulate red blood cell growth.  Having a blood transfusion. This may be needed if you lose a lot of blood.  Making changes to your diet.  Having surgery to remove your spleen. Follow these instructions at home:  Take over-the-counter and prescription medicines only as told by your health care provider.  Take supplements only  as told by your health care provider.  Follow any diet instructions that you were given by your health care provider.  Keep all follow-up visits as told by your health care provider. This is important. Contact a health care provider if:  You develop new bleeding anywhere in the body. Get help right away if:  You are very weak.  You are short of breath.  You have pain in your abdomen or chest.  You are dizzy or feel faint.  You have trouble concentrating.  You have bloody stools, black stools, or tarry stools.  You vomit repeatedly or you vomit up blood. These symptoms may represent a serious problem that is an emergency. Do not wait to see if the symptoms will go away. Get medical help right away. Call your local emergency services (911 in the [place] Do not drive yourself to the hospital. Summary  Anemia is a condition in which you do not have enough red blood cells or enough of a substance in your red blood cells that carries oxygen (hemoglobin).  Symptoms may occur suddenly or develop slowly.  If your anemia is mild, you may not have symptoms.  This condition is diagnosed with blood tests, a medical history, and a physical exam. Other tests may be needed.  Treatment for this condition depends on the cause of the anemia. This information is not intended to replace advice given to you by  your health care provider. Make sure you discuss any questions you have with your health care provider. Document Revised: 01/21/2019 Document Reviewed: 01/21/2019 Elsevier Patient Education  2021 [employer].

## 2020-05-29 NOTE — Discharge Summary (Signed)
Physician Discharge Summary  Edward Valdez ZOX:[id number] DOB: 1976-02-15 DOA: 05/23/2020  PCP: [facility]  Admit date: 05/23/2020 Discharge date: 05/29/2020  Time spent: 45 minutes  Recommendations for Outpatient Follow-up:  Patient will be discharged to home.  Patient will need to follow up with primary care provider within one week of discharge, repeat CBC and CMP.  Patient should continue medications as prescribed.  Patient should follow a regular diet.    Discharge Diagnoses:  Shortness of breath Symptomatic anemia-possibly secondary to epistaxis (PTA) Lactic acidosis Sepsis secondary to Pneumonia Essential hypertension Hyponatremia Alcohol dependence with uncomplicated withdrawal Alcoholic liver disease with anasarca Elevated D-dimer Hypokalemia Poor nutritional status  Discharge Condition: Stable  Diet recommendation: Regular  Filed Weights   05/23/20 2208  Weight: 92 kg    History of present illness:  on 05/23/2020 by Dr. Shauna Hugh 45 year old male with past medical history of alcohol abuse, hypertension who presents to [facility] emergency department with dyspnea on exertion.  Patient explains that for the past 2 weeks he has been experiencing shortness of breath. The shortness of breath was initially mild in intensity but as the days progressed shortness of breath became more and more severe. Shortness of breath is worse with exertion and improved with rest. Over the span of time patient is complaining of associated bilateral lower extremity edema, increasing abdominal girth, generalized malaise and weakness as well as episodes of paroxysmal nocturnal dyspnea and progressive yellowing of the skin.  Patient additionally is complaining of episodic chest tightness. Mild to moderate in intensity, located in the midsternal region without any alleviating or exacerbating factors.  Upon further questioning patient denies fever,  sick contacts, recent travel or contact with confirmed COVID-19 infection.  Because of patient's progressively worsening symptoms patient's primary care provider sent the patient to [facility] emergency department for evaluation.  Upon evaluation in the emergency department patient has found to have multiple ongoing medical issues including bilateral infiltrates on chest imaging concerning for a combination of pneumonia and pulmonary edema. Patient also found to have substantial hyponatremia of 119 and substantial anemia with hemoglobin of 5.9. 2 units of packed red blood cells were ordered for transfusion. Patient was administered 40 mg of intravenous Lasix. The hospitalist group was then called to assess the patient for admission to the hospital.  Hospital Course:  Shortness of breath -Patient presented with several week history of progressively worsening shortness of breath on exertion and paroxysmal nocturnal dyspnea -Symptoms are multifactorial in origin including anemia (patient presented with a hemoglobin of 5.9), pneumonia, pulmonary edema -Treating underlying conditions, patient did receive blood transfusion as well as IV Lasix, antibiotics and Aldactone -Continue supplemental oxygen and wean as possible -Echocardiogram obtained showing an EF of 65 to 70%, LV has hyperdynamic function.  No regional wall motion abnormalities.  Mild LVH.  Diastolic parameters are normal -improved with blood transfusion  Symptomatic anemia-possibly secondary to epistaxis (PTA) -As above, hemoglobin was 5.9 on admission -Patient received 4 units PRBC during hospitalization -Hemoglobin 7.8 -Anemia be secondary to myelosuppression due to heavy alcohol abuse versus possible GI bleed -Continue PPI -Gastroenterology consulted and appreciated -EGD showed normal appearing widely patent esophagus and GE J, no esophageal varices noted, normal-appearing stomach and duodenum. -Colonoscopy showed a 3  mm polyp in the ascending colon which was removed with a cold snare.  Presumed source of anemia is from epistaxis.  Diverticulosis in the sigmoid colon. -Patient tells me that he has been following with  Dr. Suszanne Connerseoh, ENT, for his epistaxis -Repeat CBC in one week  Lactic acidosis -Resolved  Sepsis secondary to Pneumonia -Present on admission, patient was noted to be tachycardic as well as tachypneic with leukocytosis -Chest x-ray on admission showed a masslike opacity right upper lobe measuring 4.2 cm in projection.  Concerning for malignancy but may reflect infectious airspace disease.  Consider CT to further evaluate as well as x-ray in 6 to 8 weeks. -CTA chest showed no central pulmonary embolism.  Multifocal groundglass and consolidative opacities throughout both lungs.  No pulmonary mass noted. -was placed on ceftriaxone and azithromycin and completed course during hospitalization  Essential hypertension -Continue diuretics, Lasix as well as spironolactone -BP stable  Hyponatremia -Suspect secondary to beer [place]oto mania/heavy alcohol abuse with volume overload -Sodium as low as 116 on admission, up to 128 today -repeat BMP in one week  Alcohol dependence with uncomplicated withdrawal -Patient states that he drinks half of 1/5 of bourbon per day along with 8 shots minimum -No signs of withdrawal -Cessation discussed  Alcoholic liver disease with anasarca -Patient with evidence of jaundice on exam -Madreys discriminant function calculates to 16.8 which is suggestive that steroids are not necessary -Continue Lasix, spironolactone -Discussed cessation  Elevated D-dimer -CTA negative -Lower extremity doppler done on 05/21/2020 (prior to admission) was unremarkable for DVT  Hypokalemia -Despite replacement, potassium remains 2.9 -Will continue to replace  -Magnesium level 1.9  -Continue to monitor CMP  Poor nutritional status -Patient with albumin of 2 -Nutrition  consulted and appreciated -continue supplements -discussed diet with patient and wife  Consultants Gastroenterology  Procedures  Abdominal ultrasound EGD Colonoscopy  Discharge Exam: Vitals:   05/29/20 0007 05/29/20 0455  BP: 138/89 113/81  Pulse: 98 92  Resp: 19 18  Temp: 99.4 F (37.4 C) 99 F (37.2 C)  SpO2: 95% 94%    Exam  General: Well developed, chronically ill-appearing, NAD, jaundiced  HEENT: NCAT, mucous membranes moist.   Cardiovascular: S1 S2 auscultated, RRR  Respiratory: Clear to auscultation bilaterally, no wheezing  Abdomen: Soft, nontender, nondistended, + bowel sounds  Extremities: warm dry without cyanosis clubbing.  LE edema  Neuro: AAOx3, nonfocal  Psych: appropriate mood and affect, pleasant   Discharge Instructions Discharge Instructions    Discharge instructions   Complete by: As directed    Patient will be discharged to home.  Patient will need to follow up with primary care provider within one week of discharge, repeat CBC and CMP.  Patient should continue medications as prescribed.  Patient should follow a regular diet.   Increase activity slowly   Complete by: As directed      Allergies as of 05/29/2020   No Known Allergies     Medication List    STOP taking these medications   amLODipine 10 MG tablet Commonly known as: NORVASC     TAKE these medications   dextroamphetamine 10 MG tablet Commonly known as: DEXTROSTAT Take 20 mg by mouth 2 (two) times daily.   Ensure Max Protein Liqd Take 330 mLs (11 oz total) by mouth daily.   furosemide 20 MG tablet Commonly known as: Lasix Take 1 tablet (20 mg total) by mouth daily.   IRON PO Take 1 tablet by mouth daily.   lactulose 10 GM/15ML solution Commonly known as: CHRONULAC Take 15 mLs (10 g total) by mouth daily.   LORazepam 0.5 MG tablet Commonly known as: Ativan Take 1 tablet (0.5 mg total) by mouth 2 (two) times daily as needed for  anxiety.   magnesium 30 MG  tablet Take 30 mg by mouth daily.   multivitamin with minerals Tabs tablet Take 1 tablet by mouth daily.   Potassium 99 MG Tabs Take 1 tablet by mouth daily.   spironolactone 100 MG tablet Commonly known as: ALDACTONE Take 0.5 tablets (50 mg total) by mouth daily. What changed: how much to take      No Known Allergies    The results of significant diagnostics from this hospitalization (including imaging, microbiology, ancillary and laboratory) are listed below for reference.    Significant Diagnostic Studies: DG Chest 2 View  Result Date: 05/23/2020 CLINICAL DATA:  Shortness of breath EXAM: CHEST - 2 VIEW COMPARISON:  None. FINDINGS: Cardiomegaly. There is a rounded, masslike opacity of the central right upper lobe which measures approximately 4.2 cm in projection. There is superimposed heterogeneous and interstitial airspace opacity, particularly of the perihilar left lung. Small left, trace right pleural effusions. The visualized skeletal structures are unremarkable. IMPRESSION: 1. There is a rounded, masslike opacity of the central right upper lobe which measures approximately 4.2 cm in projection. This is concerning for malignancy, although may reflect infectious airspace disease. Consider CT to further evaluate. Absolute minimum recommend radiographic follow-up at 6-8 weeks to ensure complete resolution. 2. There is superimposed heterogeneous and interstitial airspace opacity, particularly of the perihilar left lung. Small left, trace right pleural effusions. Findings suggest edema or infection. 3. Cardiomegaly. Electronically Signed   By: Lauralyn Primes M.D.   On: 05/23/2020 15:19   CT Angio Chest PE W/Cm &/Or Wo Cm  Result Date: 05/23/2020 CLINICAL DATA:  PE suspected, low/intermediate prob, positive D-dimer Shortness of breath, possible PE, lung mass EXAM: CT ANGIOGRAPHY CHEST WITH CONTRAST TECHNIQUE: Multidetector CT imaging of the chest was performed using the standard protocol  during bolus administration of intravenous contrast. Multiplanar CT image reconstructions and MIPs were obtained to evaluate the vascular anatomy. CONTRAST:  75mL OMNIPAQUE IOHEXOL 350 MG/ML SOLN COMPARISON:  Radiograph earlier today. FINDINGS: Cardiovascular: Evaluation for pulmonary embolus is diagnostic to the lobar levels in the mid and upper lung zones and segmental levels at the bases. No evidence of pulmonary embolus. Normal caliber thoracic aorta without acute aortic abnormality. Upper normal heart size. No pericardial effusion. Suggestion of scattered coronary artery calcifications, not well assessed due to motion on the current exam. Mediastinum/Nodes: Small mediastinal lymph nodes not enlarged by size criteria. No enlarged hilar lymph nodes. No axillary adenopathy. Patulous esophagus. No esophageal wall thickening. No suspicious thyroid nodule. Lungs/Pleura: There is no pulmonary mass. The masslike opacity on radiograph corresponds to a rounded area of ground-glass and consolidative opacity in the right upper lobe. Similar ground-glass and consolidative opacities within the apical and posterior segments of the right upper lobe, anterior left upper lobe, lingula, and to a lesser extent right middle and lower lobe. Some of these ground-glass opacities appear slightly nodular. Small to moderate left pleural effusion with adjacent compressive atelectasis. Trace right pleural effusion with adjacent atelectasis. Trace fluid in the fissures. There is minimal basilar septal thickening. Upper Abdomen: Diffuse hepatic steatosis. Included liver appears enlarged. No acute upper abdominal findings. Musculoskeletal: There are no acute or suspicious osseous abnormalities. Review of the MIP images confirms the above findings. IMPRESSION: 1. No central pulmonary embolus. 2. Multifocal ground-glass and consolidative opacities throughout both lungs. Differential considerations include pulmonary edema or atypical pneumonia.  3. No pulmonary mass. Radiographic appearance in the right lung correlates to a rounded area of ground-glass and consolidative opacity  in the right upper lobe, as described above. 4. Small to moderate left and trace right pleural effusions with adjacent compressive atelectasis. 5. Suspect coronary artery calcifications. 6. Incidental note of hepatic steatosis and probable hepatomegaly in the upper abdomen. Electronically Signed   By: Narda Rutherford M.D.   On: 05/23/2020 18:43   US Venous Img Lower Bilateral (DVT)  Result Date: 05/21/2020 CLINICAL DATA:  Lower extremity edema EXAM: BILATERAL LOWER EXTREMITY VENOUS DUPLEX ULTRASOUND TECHNIQUE: Gray-scale sonography with graded compression, as well as color Doppler and duplex ultrasound were performed to evaluate the lower extremity deep venous systems from the level of the common femoral vein and including the common femoral, femoral, profunda femoral, popliteal and calf veins including the posterior tibial, peroneal and gastrocnemius veins when visible. The superficial great saphenous vein was also interrogated. Spectral Doppler was utilized to evaluate flow at rest and with distal augmentation maneuvers in the common femoral, femoral and popliteal veins. COMPARISON:  None. FINDINGS: RIGHT LOWER EXTREMITY Common Femoral Vein: No evidence of thrombus. Normal compressibility, respiratory phasicity and response to augmentation. Saphenofemoral Junction: No evidence of thrombus. Normal compressibility and flow on color Doppler imaging. Profunda Femoral Vein: No evidence of thrombus. Normal compressibility and flow on color Doppler imaging. Femoral Vein: No evidence of thrombus. Normal compressibility, respiratory phasicity and response to augmentation. Popliteal Vein: No evidence of thrombus. Normal compressibility, respiratory phasicity and response to augmentation. Calf Veins: No evidence of thrombus. Normal compressibility and flow on color Doppler imaging.  Superficial Great Saphenous Vein: No evidence of thrombus. Normal compressibility. Venous Reflux:  None. Other Findings:  There is lower extremity soft tissue edema. LEFT LOWER EXTREMITY Common Femoral Vein: No evidence of thrombus. Normal compressibility, respiratory phasicity and response to augmentation. Saphenofemoral Junction: No evidence of thrombus. Normal compressibility and flow on color Doppler imaging. Profunda Femoral Vein: No evidence of thrombus. Normal compressibility and flow on color Doppler imaging. Femoral Vein: No evidence of thrombus. Normal compressibility, respiratory phasicity and response to augmentation. Popliteal Vein: No evidence of thrombus. Normal compressibility, respiratory phasicity and response to augmentation. Calf Veins: No evidence of thrombus. Normal compressibility and flow on color Doppler imaging. Superficial Great Saphenous Vein: No evidence of thrombus. Normal compressibility. Venous Reflux:  None. Other Findings:  There is lower extremity soft tissue edema. IMPRESSION: No evidence of deep venous thrombosis in either lower extremity. Lower extremity soft tissue edema noted bilaterally. Electronically Signed   By: Bretta Bang III M.D.   On: 05/21/2020 13:17   ECHOCARDIOGRAM COMPLETE  Result Date: 05/24/2020    ECHOCARDIOGRAM REPORT   Patient Name:   ARKEL CARTWRIGHT Date of Exam: 05/24/2020 Medical Rec #:  [id number]    Height:       72.0 in Accession #:    [id number]   Weight:       202.8 lb Date of Birth:  11-10-1975    BSA:          2.143 m Patient Age:    45 years     BP:           131/80 mmHg Patient Gender: M            HR:           99 bpm. Exam Location:  Inpatient Procedure: 2D Echo, Cardiac Doppler and Color Doppler Indications:    CHF  History:        Patient has no prior history of Echocardiogram examinations.  CHF, Signs/Symptoms:Shortness of Breath; Risk                 Factors:Hypertension.  Sonographer:    Neomia Dear RDCS Referring Phys:  [id number] [facility]  Sonographer Comments: Suboptimal subcostal window. IMPRESSIONS  1. Left ventricular ejection fraction, by estimation, is 65 to 70%. The left ventricle has hyperdynamic function. The left ventricle has no regional wall motion abnormalities. There is mild left ventricular hypertrophy. Left ventricular diastolic parameters were normal.  2. Right ventricular systolic function is normal. The right ventricular size is normal. There is normal pulmonary artery systolic pressure. The estimated right ventricular systolic pressure is 29.8 mmHg.  3. Left atrial size was mildly dilated.  4. The aortic valve is tricuspid. Mild aortic valve sclerosis is present. Mean aortic valve gradient 17 mmHg, but calculated AVA > 2 cm^2. The valve appears to open well, doubt clinically significant stenosis (possibly elevated gradient due to high flow).  5. The mitral valve is normal in structure. No evidence of mitral valve regurgitation. No evidence of mitral stenosis.  6. Aortic dilatation noted. There is mild dilatation of the aortic root, measuring 37 mm.  7. The inferior vena cava is normal in size with greater than 50% respiratory variability, suggesting right atrial pressure of 3 mmHg. FINDINGS  Left Ventricle: Left ventricular ejection fraction, by estimation, is 65 to 70%. The left ventricle has hyperdynamic function. The left ventricle has no regional wall motion abnormalities. The left ventricular internal cavity size was normal in size. There is mild left ventricular hypertrophy. Left ventricular diastolic parameters were normal. Right Ventricle: The right ventricular size is normal. No increase in right ventricular wall thickness. Right ventricular systolic function is normal. There is normal pulmonary artery systolic pressure. The tricuspid regurgitant velocity is 2.59 m/s, and  with an assumed right atrial pressure of 3 mmHg, the estimated right ventricular systolic pressure is 29.8 mmHg. Left Atrium:  Left atrial size was mildly dilated. Right Atrium: Right atrial size was normal in size. Pericardium: Trivial pericardial effusion is present. Mitral Valve: The mitral valve is normal in structure. No evidence of mitral valve regurgitation. No evidence of mitral valve stenosis. Tricuspid Valve: The tricuspid valve is normal in structure. Tricuspid valve regurgitation is trivial. Aortic Valve: The aortic valve is tricuspid. Aortic valve regurgitation is not visualized. Mild aortic valve sclerosis is present, with no evidence of aortic valve stenosis. Aortic valve mean gradient measures 15.0 mmHg. Aortic valve peak gradient measures 26.2 mmHg. Aortic valve area, by VTI measures 2.51 cm. Pulmonic Valve: The pulmonic valve was normal in structure. Pulmonic valve regurgitation is not visualized. Aorta: Aortic dilatation noted. There is mild dilatation of the aortic root, measuring 37 mm. Venous: The inferior vena cava is normal in size with greater than 50% respiratory variability, suggesting right atrial pressure of 3 mmHg. IAS/Shunts: No atrial level shunt detected by color flow Doppler.  LEFT VENTRICLE PLAX 2D LVIDd:         5.10 cm      Diastology LVIDs:         2.60 cm      LV e' medial:    12.40 cm/s LV PW:         1.20 cm      LV E/e' medial:  11.5 LV IVS:        1.20 cm      LV e' lateral:   9.68 cm/s LVOT diam:     2.00 cm  LV E/e' lateral: 14.8 LV SV:         113 LV SV Index:   53 LVOT Area:     3.14 cm  LV Volumes (MOD) LV vol d, MOD A2C: 108.0 ml LV vol d, MOD A4C: 139.0 ml LV vol s, MOD A2C: 33.4 ml LV vol s, MOD A4C: 28.0 ml LV SV MOD A2C:     74.6 ml LV SV MOD A4C:     139.0 ml LV SV MOD BP:      93.5 ml  PULMONARY VEINS A Reversal Duration: 111.00 msec A Reversal Velocity: 43.90 cm/s Diastolic Velocity:  113.00 cm/s S/D Velocity:        0.70 Systolic Velocity:   82.70 cm/s LEFT ATRIUM             Index       RIGHT ATRIUM           Index LA diam:        3.30 cm 1.54 cm/m  RA Area:     12.20 cm LA  Vol (A2C):   90.0 ml 41.99 ml/m RA Volume:   24.50 ml  11.43 ml/m LA Vol (A4C):   56.7 ml 26.45 ml/m LA Biplane Vol: 76.0 ml 35.46 ml/m  AORTIC VALVE                    PULMONIC VALVE AV Area (Vmax):    2.49 cm     PV Vmax:       1.23 m/s AV Area (Vmean):   2.33 cm     PV Vmean:      86.700 cm/s AV Area (VTI):     2.51 cm     PV VTI:        0.211 m AV Vmax:           256.00 cm/s  PV Peak grad:  6.1 mmHg AV Vmean:          180.000 cm/s PV Mean grad:  4.0 mmHg AV VTI:            0.450 m AV Peak Grad:      26.2 mmHg AV Mean Grad:      15.0 mmHg LVOT Vmax:         202.50 cm/s LVOT Vmean:        133.500 cm/s LVOT VTI:          0.360 m LVOT/AV VTI ratio: 0.80  AORTA Ao Root diam: 3.70 cm Ao Asc diam:  3.30 cm MITRAL VALVE                TRICUSPID VALVE MV Area (PHT): 3.16 cm     TR Peak grad:   26.8 mmHg MV Decel Time: 240 msec     TR Vmax:        259.00 cm/s MV E velocity: 143.00 cm/s MV A velocity: 130.00 cm/s  SHUNTS MV E/A ratio:  1.10         Systemic VTI:  0.36 m                             Systemic Diam: 2.00 cm Marca Ancona MD Electronically signed by Marca Ancona MD Signature Date/Time: 05/24/2020/1:42:04 PM    Final    [place] ASCITES (ABDOMEN LIMITED)  Result Date: 05/24/2020 CLINICAL DATA:  Ascites check EXAM: LIMITED ABDOMEN ULTRASOUND FOR ASCITES TECHNIQUE: Limited ultrasound survey for ascites was performed in  all four abdominal quadrants. COMPARISON:  None. FINDINGS: Small volume ascites predominantly located within the right upper quadrant along the liver without pocket measuring approximately 6.4 cm in diameter. IMPRESSION: Small volume ascites predominantly located within the right upper quadrant. Electronically Signed   By: Maudry Mayhew MD   On: 05/24/2020 00:14   US Abdomen Limited RUQ (LIVER/GB)  Result Date: 05/24/2020 CLINICAL DATA:  Cirrhosis, ascites EXAM: ULTRASOUND ABDOMEN LIMITED RIGHT UPPER QUADRANT COMPARISON:  None. FINDINGS: Gallbladder: A small amount of layering sludge is  seen within the gallbladder lumen. The gallbladder, however, is not distended and there is no gallbladder wall thickening identified. There is trace pericholecystic fluid, nonspecific in the setting of ascites. The sonographic Eulah Pont sign is reportedly negative. Common bile duct: Diameter: 3 mm in proximal diameter Liver: The hepatic parenchyma is diffusely markedly echogenic and there is poor acoustic through transmission and diffuse coarsening of the hepatic echotexture. Altogether, the findings are in keeping with moderate to severe hepatic steatosis. Liver size is mildly enlarged. No definite focal intrahepatic masses are identified though parenchymal changes decreased sensitivity for detection of such masses. There is no intrahepatic biliary ductal dilation identified. Portal vein is patent on color Doppler imaging with normal direction of blood flow towards the liver. Other: Trace perihepatic ascites is noted. IMPRESSION: Cholelithiasis without sonographic evidence of acute cholecystitis. Moderate to severe hepatic steatosis.  Mild hepatomegaly. Mild perihepatic ascites. Electronically Signed   By: Helyn Numbers MD   On: 05/24/2020 07:28    Microbiology: Recent Results (from the past 240 hour(s))  Resp Panel by RT-PCR (Flu A&B, Covid) Nasopharyngeal Swab     Status: None   Collection Time: 05/23/20  5:13 PM   Specimen: Nasopharyngeal Swab; Nasopharyngeal(NP) swabs in vial transport medium  Result Value Ref Range Status   SARS Coronavirus 2 by RT PCR NEGATIVE NEGATIVE Final    Comment: (NOTE) SARS-CoV-2 target nucleic acids are NOT DETECTED.  The SARS-CoV-2 RNA is generally detectable in upper respiratory specimens during the acute phase of infection. The lowest concentration of SARS-CoV-2 viral copies this assay can detect is 138 copies/mL. A negative result does not preclude SARS-Cov-2 infection and should not be used as the sole basis for treatment or other patient management decisions. A  negative result may occur with  improper specimen collection/handling, submission of specimen other than nasopharyngeal swab, presence of viral mutation(s) within the areas targeted by this assay, and inadequate number of viral copies(<138 copies/mL). A negative result must be combined with clinical observations, patient history, and epidemiological information. The expected result is Negative.  Fact Sheet for Patients:  BloggerCourse.com  Fact Sheet for Healthcare Providers:  SeriousBroker.it  This test is no t yet approved or cleared by the [place] FDA and  has been authorized for detection and/or diagnosis of SARS-CoV-2 by FDA under an Emergency Use Authorization (EUA). This EUA will remain  in effect (meaning this test can be used) for the duration of the COVID-19 declaration under Section 564(b)(1) of the Act, 21 U.S.C.section 360bbb-3(b)(1), unless the authorization is terminated  or revoked sooner.       Influenza A by PCR NEGATIVE NEGATIVE Final   Influenza B by PCR NEGATIVE NEGATIVE Final    Comment: (NOTE) The Xpert Xpress SARS-CoV-2/FLU/RSV plus assay is intended as an aid in the diagnosis of influenza from Nasopharyngeal swab specimens and should not be used as a sole basis for treatment. Nasal washings and aspirates are unacceptable for Xpert Xpress SARS-CoV-2/FLU/RSV testing.  Fact Sheet  for Patients: BloggerCourse.com  Fact Sheet for Healthcare Providers: SeriousBroker.it  This test is not yet approved or cleared by the [place] FDA and has been authorized for detection and/or diagnosis of SARS-CoV-2 by FDA under an Emergency Use Authorization (EUA). This EUA will remain in effect (meaning this test can be used) for the duration of the COVID-19 declaration under Section 564(b)(1) of the Act, 21 U.S.C. section 360bbb-3(b)(1), unless the authorization  is terminated or revoked.  Performed at [facility], [address], [place], [place] [address]   Culture, blood (routine x 2)     Status: None (Preliminary result)   Collection Time: 05/24/20  5:16 AM   Specimen: BLOOD LEFT ARM  Result Value Ref Range Status   Specimen Description BLOOD LEFT ARM  Final   Special Requests   Final    BOTTLES DRAWN AEROBIC ONLY Blood Culture results may not be optimal due to an inadequate volume of blood received in culture bottles   Culture   Final    NO GROWTH 4 DAYS Performed at [facility], [address] [place], [place] [address]    Report Status PENDING  Incomplete  Culture, blood (routine x 2)     Status: None (Preliminary result)   Collection Time: 05/24/20  5:16 AM   Specimen: BLOOD RIGHT ARM  Result Value Ref Range Status   Specimen Description BLOOD RIGHT ARM  Final   Special Requests   Final    BOTTLES DRAWN AEROBIC ONLY Blood Culture results may not be optimal due to an inadequate volume of blood received in culture bottles   Culture   Final    NO GROWTH 4 DAYS Performed at [facility], [address] [place], [place] [address]    Report Status PENDING  Incomplete     Labs: Basic Metabolic Panel: Recent Labs  Lab 05/23/20 2203 05/23/20 2245 05/24/20 0743 05/24/20 1835 05/25/20 1541 05/26/20 0727 05/26/20 1525 05/27/20 0018 05/28/20 0454 05/29/20 0225  NA  --    < > 120*   < > 123* 126*  --  128* 128* 128*  K  --    < > 3.4*   < > 3.4* 3.1*  --  3.1* 2.9* 3.2*  CL  --    < > 86*   < > 89* 91*  --  92* 94* 94*  CO2  --    < > 25   < > 26 27  --  27 26 25   GLUCOSE  --    < > 98   < > 105* 83  --  97 91 103*  BUN  --    < > 6   < > 6 <5*  --  <5* 5* 5*  CREATININE  --    < > 0.62   < > 0.67 0.66  --  0.73 0.65 0.63  CALCIUM  --    < > 8.1*   < > 7.8* 7.8*  --  7.8* 8.1* 7.7*  MG 1.9  --  1.9  --   --   --  1.9  --   --   --    < > = values in this interval not displayed.   Liver Function  Tests: Recent Labs  Lab 05/25/20 0252 05/26/20 0727 05/27/20 0018 05/28/20 0454 05/29/20 0225  AST 114* 103* 101* 91* 88*  ALT 15 14 16 16 16   ALKPHOS 145* 132* 136* 146* 119  BILITOT  8.1* 8.5* 8.0* 6.3* 7.3*  PROT 7.1 6.8 7.1 6.6 6.5  ALBUMIN 2.1* 2.1* 2.1* 2.0* 1.9*   No results for input(s): LIPASE, AMYLASE in the last 168 hours. Recent Labs  Lab 05/23/20 2203  AMMONIA 51*   CBC: Recent Labs  Lab 05/25/20 0252 05/25/20 1541 05/26/20 0727 05/27/20 0018 05/28/20 0454 05/28/20 1904 05/29/20 0225  WBC 17.7* 19.0* 17.6* 17.7* 16.4*  --   --   HGB 7.0* 7.0* 7.2* 7.3* 7.2* 8.2* 7.8*  HCT 21.0* 22.0* 21.9* 23.5* 23.4* 26.9* 25.2*  MCV 86.4 88.0 88.0 91.4 91.4  --   --   PLT 210 212 209 225 244  --   --    Cardiac Enzymes: No results for input(s): CKTOTAL, CKMB, CKMBINDEX, TROPONINI in the last 168 hours. BNP: BNP (last 3 results) Recent Labs    05/23/20 1501  BNP 329.2*    ProBNP (last 3 results) No results for input(s): PROBNP in the last 8760 hours.  CBG: No results for input(s): GLUCAP in the last 168 hours.     Signed:  Edsel Petrin  [facility] 05/29/2020, 9:52 AM

## 2020-05-31 LAB — SURGICAL PATHOLOGY

## 2020-06-03 ENCOUNTER — Other Ambulatory Visit: Payer: Self-pay

## 2020-06-03 DIAGNOSIS — D500 Iron deficiency anemia secondary to blood loss (chronic): Secondary | ICD-10-CM | POA: Diagnosis not present

## 2020-06-03 DIAGNOSIS — K709 Alcoholic liver disease, unspecified: Secondary | ICD-10-CM | POA: Diagnosis not present

## 2020-06-03 DIAGNOSIS — J189 Pneumonia, unspecified organism: Secondary | ICD-10-CM | POA: Diagnosis not present

## 2020-06-03 DIAGNOSIS — E871 Hypo-osmolality and hyponatremia: Secondary | ICD-10-CM | POA: Diagnosis not present

## 2020-06-03 DIAGNOSIS — F1020 Alcohol dependence, uncomplicated: Secondary | ICD-10-CM | POA: Diagnosis not present

## 2020-06-03 DIAGNOSIS — E876 Hypokalemia: Secondary | ICD-10-CM | POA: Diagnosis not present

## 2020-06-08 DIAGNOSIS — R040 Epistaxis: Secondary | ICD-10-CM | POA: Diagnosis not present

## 2020-06-10 DIAGNOSIS — F900 Attention-deficit hyperactivity disorder, predominantly inattentive type: Secondary | ICD-10-CM | POA: Diagnosis not present

## 2020-06-23 DIAGNOSIS — D500 Iron deficiency anemia secondary to blood loss (chronic): Secondary | ICD-10-CM | POA: Diagnosis not present

## 2020-06-23 DIAGNOSIS — J189 Pneumonia, unspecified organism: Secondary | ICD-10-CM | POA: Diagnosis not present

## 2020-06-23 DIAGNOSIS — E871 Hypo-osmolality and hyponatremia: Secondary | ICD-10-CM | POA: Diagnosis not present

## 2020-06-23 DIAGNOSIS — K709 Alcoholic liver disease, unspecified: Secondary | ICD-10-CM | POA: Diagnosis not present

## 2020-08-04 DIAGNOSIS — R600 Localized edema: Secondary | ICD-10-CM | POA: Diagnosis not present

## 2020-08-04 DIAGNOSIS — K760 Fatty (change of) liver, not elsewhere classified: Secondary | ICD-10-CM | POA: Diagnosis not present

## 2020-08-04 DIAGNOSIS — F1010 Alcohol abuse, uncomplicated: Secondary | ICD-10-CM | POA: Diagnosis not present

## 2020-09-08 DIAGNOSIS — R040 Epistaxis: Secondary | ICD-10-CM | POA: Diagnosis not present

## 2020-12-07 DIAGNOSIS — F900 Attention-deficit hyperactivity disorder, predominantly inattentive type: Secondary | ICD-10-CM | POA: Diagnosis not present

## 2021-04-21 DIAGNOSIS — Z0000 Encounter for general adult medical examination without abnormal findings: Secondary | ICD-10-CM | POA: Diagnosis not present

## 2021-04-21 DIAGNOSIS — I10 Essential (primary) hypertension: Secondary | ICD-10-CM | POA: Diagnosis not present

## 2021-05-31 DIAGNOSIS — F900 Attention-deficit hyperactivity disorder, predominantly inattentive type: Secondary | ICD-10-CM | POA: Diagnosis not present

## 2021-11-23 DIAGNOSIS — F900 Attention-deficit hyperactivity disorder, predominantly inattentive type: Secondary | ICD-10-CM | POA: Diagnosis not present

## 2022-05-17 DIAGNOSIS — F900 Attention-deficit hyperactivity disorder, predominantly inattentive type: Secondary | ICD-10-CM | POA: Diagnosis not present

## 2022-10-06 IMAGING — US US EXTREM LOW VENOUS
1 series · 13 of 24 positions shown · non-contrast
Comparison: None.

CLINICAL DATA: Lower extremity edema

EXAM:
BILATERAL LOWER EXTREMITY VENOUS DUPLEX ULTRASOUND
TECHNIQUE: Gray-scale sonography with graded compression, as well as color
Doppler and duplex ultrasound were performed to evaluate the lower
extremity deep venous systems from the level of the common femoral
vein and including the common femoral, femoral, profunda femoral,
popliteal and calf veins including the posterior tibial, peroneal
and gastrocnemius veins when visible. The superficial great
saphenous vein was also interrogated. Spectral Doppler was utilized
to evaluate flow at rest and with distal augmentation maneuvers in
the common femoral, femoral and popliteal veins.

[Series 1: us extrem low venous · 0.08mm/px · 13 of 71 slices shown]
[im 1/71]
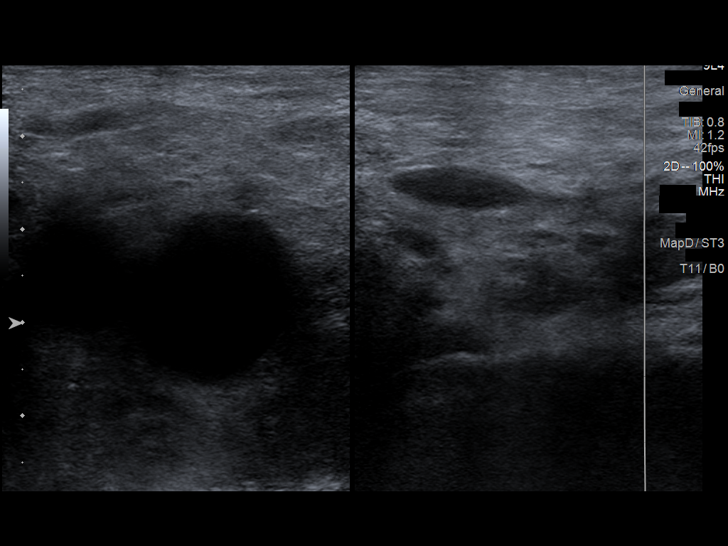
[im 7/71]
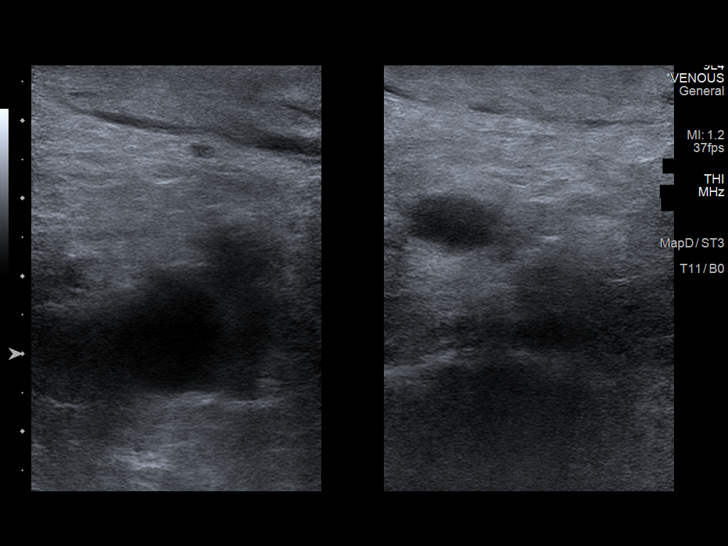
[im 13/71]
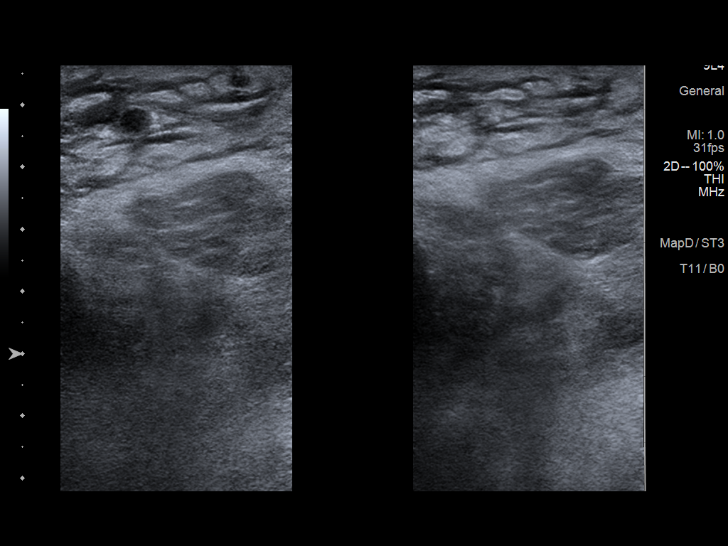
[im 19/71]
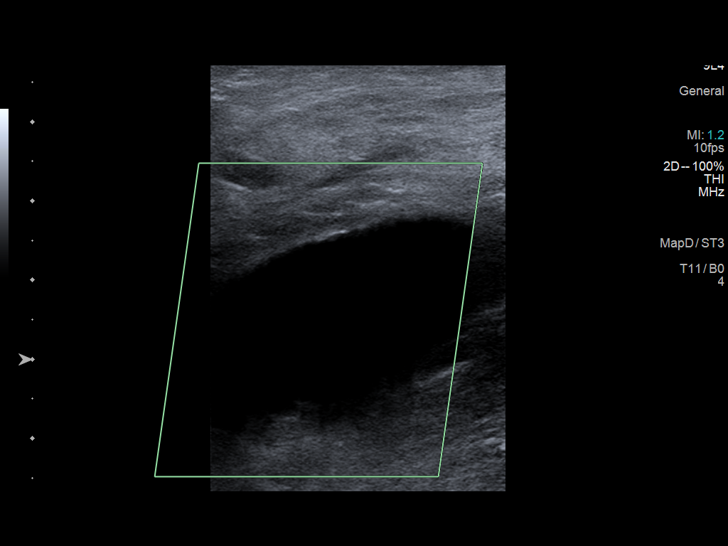
[im 25/71]
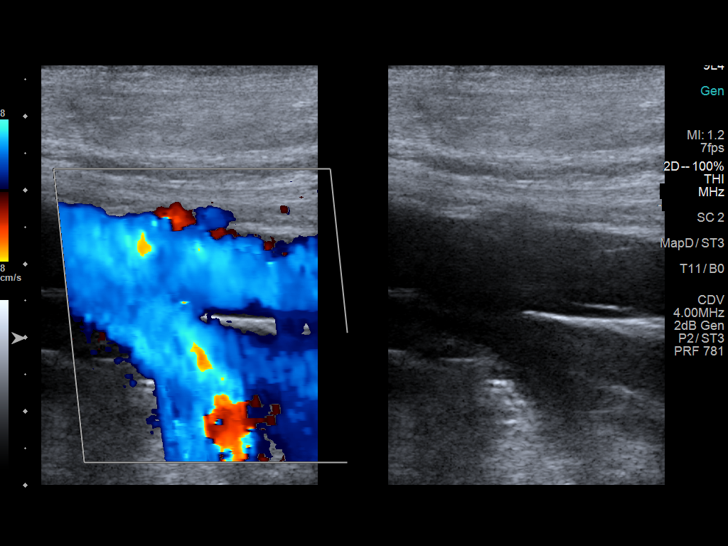
[im 31/71]
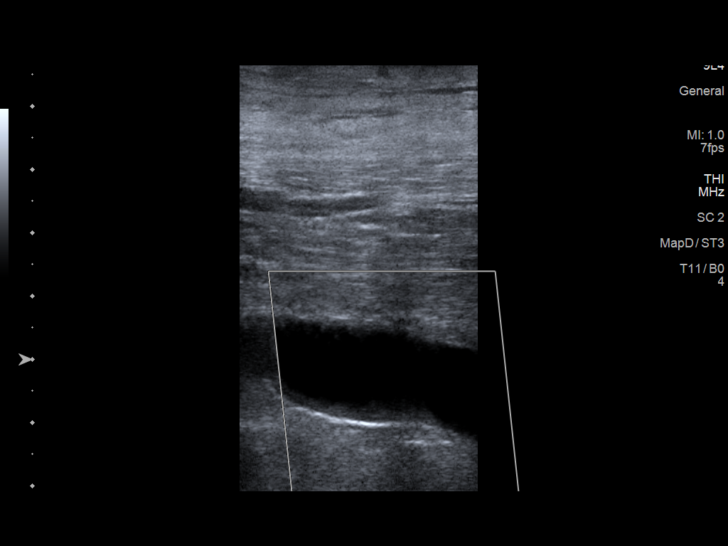
[im 37/71]
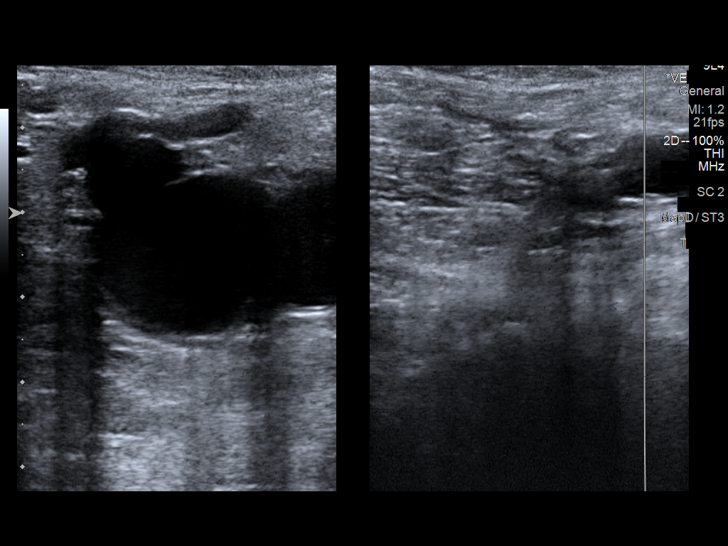
[im 40/71]
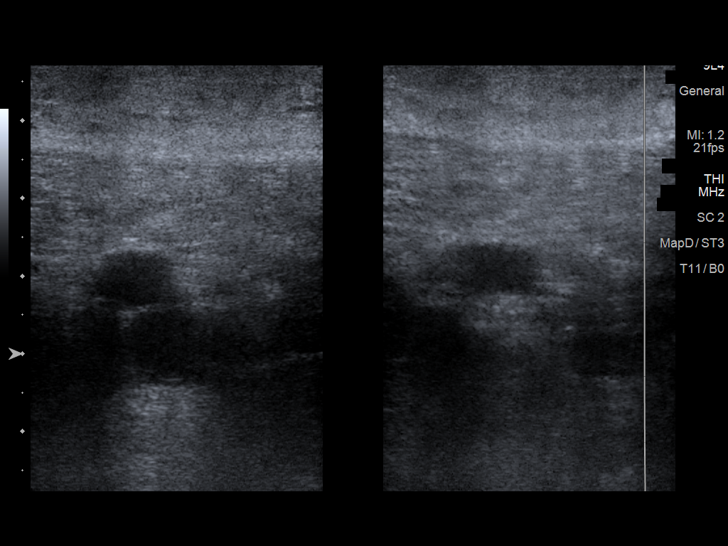
[im 46/71]
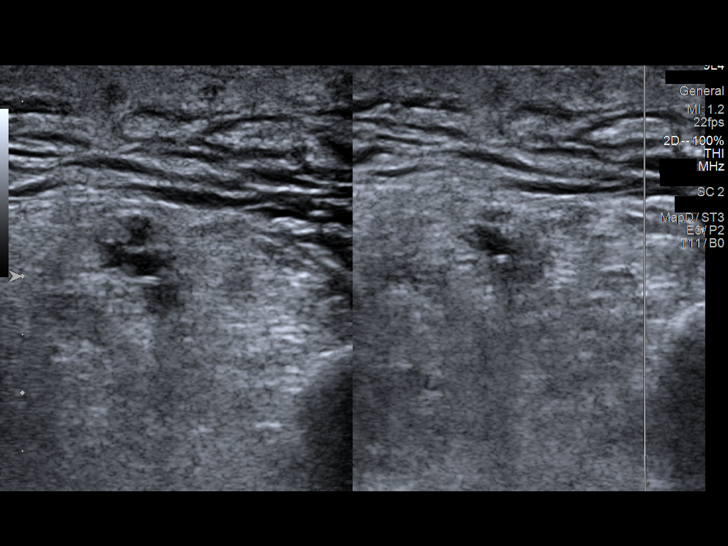
[im 52/71]
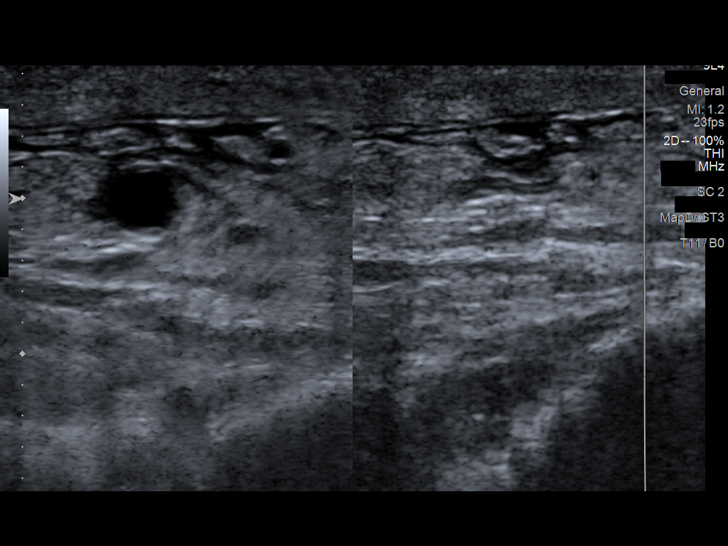
[im 58/71]
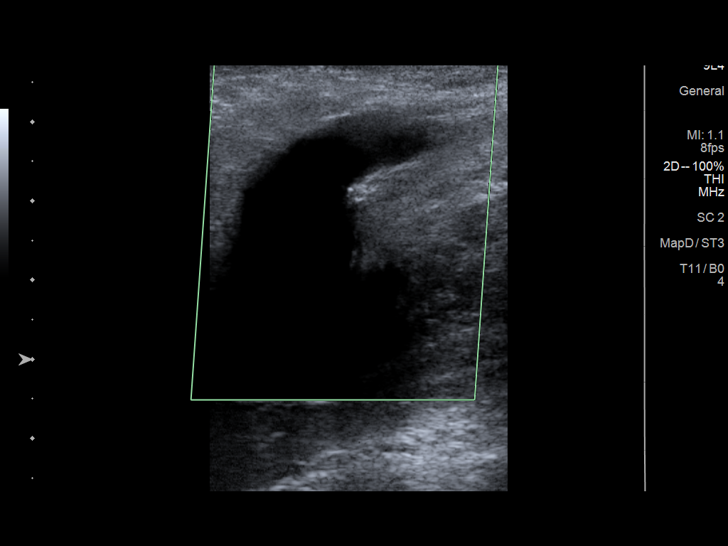
[im 64/71]
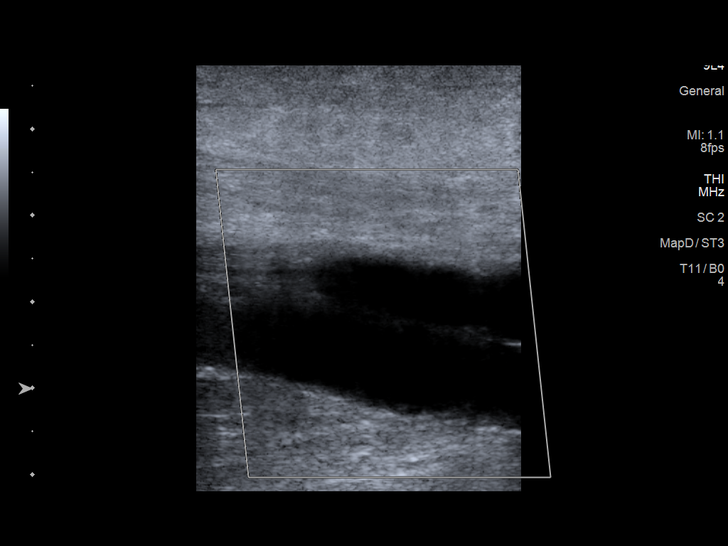
[im 71/71]
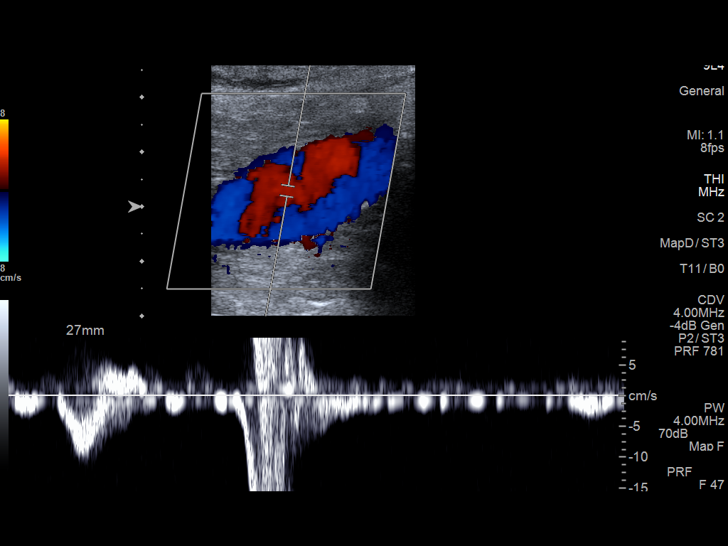

[13 of 24 positions shown; findings below may reference images not displayed]

FINDINGS: RIGHT LOWER EXTREMITY

Common Femoral Vein: No evidence of thrombus. Normal
compressibility, respiratory phasicity and response to augmentation.

Saphenofemoral Junction: No evidence of thrombus. Normal
compressibility and flow on color Doppler imaging.

Profunda Femoral Vein: No evidence of thrombus. Normal
compressibility and flow on color Doppler imaging.

Femoral Vein: No evidence of thrombus. Normal compressibility,
respiratory phasicity and response to augmentation.

Popliteal Vein: No evidence of thrombus. Normal compressibility,
respiratory phasicity and response to augmentation.

Calf Veins: No evidence of thrombus. Normal compressibility and flow
on color Doppler imaging.

Superficial Great Saphenous Vein: No evidence of thrombus. Normal
compressibility.

Venous Reflux:  None.

Other Findings:  There is lower extremity soft tissue edema.

LEFT LOWER EXTREMITY

Common Femoral Vein: No evidence of thrombus. Normal
compressibility, respiratory phasicity and response to augmentation.

Saphenofemoral Junction: No evidence of thrombus. Normal
compressibility and flow on color Doppler imaging.

Profunda Femoral Vein: No evidence of thrombus. Normal
compressibility and flow on color Doppler imaging.

Femoral Vein: No evidence of thrombus. Normal compressibility,
respiratory phasicity and response to augmentation.

Popliteal Vein: No evidence of thrombus. Normal compressibility,
respiratory phasicity and response to augmentation.

Calf Veins: No evidence of thrombus. Normal compressibility and flow
on color Doppler imaging.

Superficial Great Saphenous Vein: No evidence of thrombus. Normal
compressibility.

Venous Reflux:  None.

Other Findings:  There is lower extremity soft tissue edema.
IMPRESSION: No evidence of deep venous thrombosis in either lower extremity.
Lower extremity soft tissue edema noted bilaterally.

## 2022-10-08 IMAGING — CR DG CHEST 2V
2 series · 2 of 2 positions shown · non-contrast
Comparison: None.

CLINICAL DATA: Shortness of breath

EXAM:
CHEST - 2 VIEW

[chest pa]
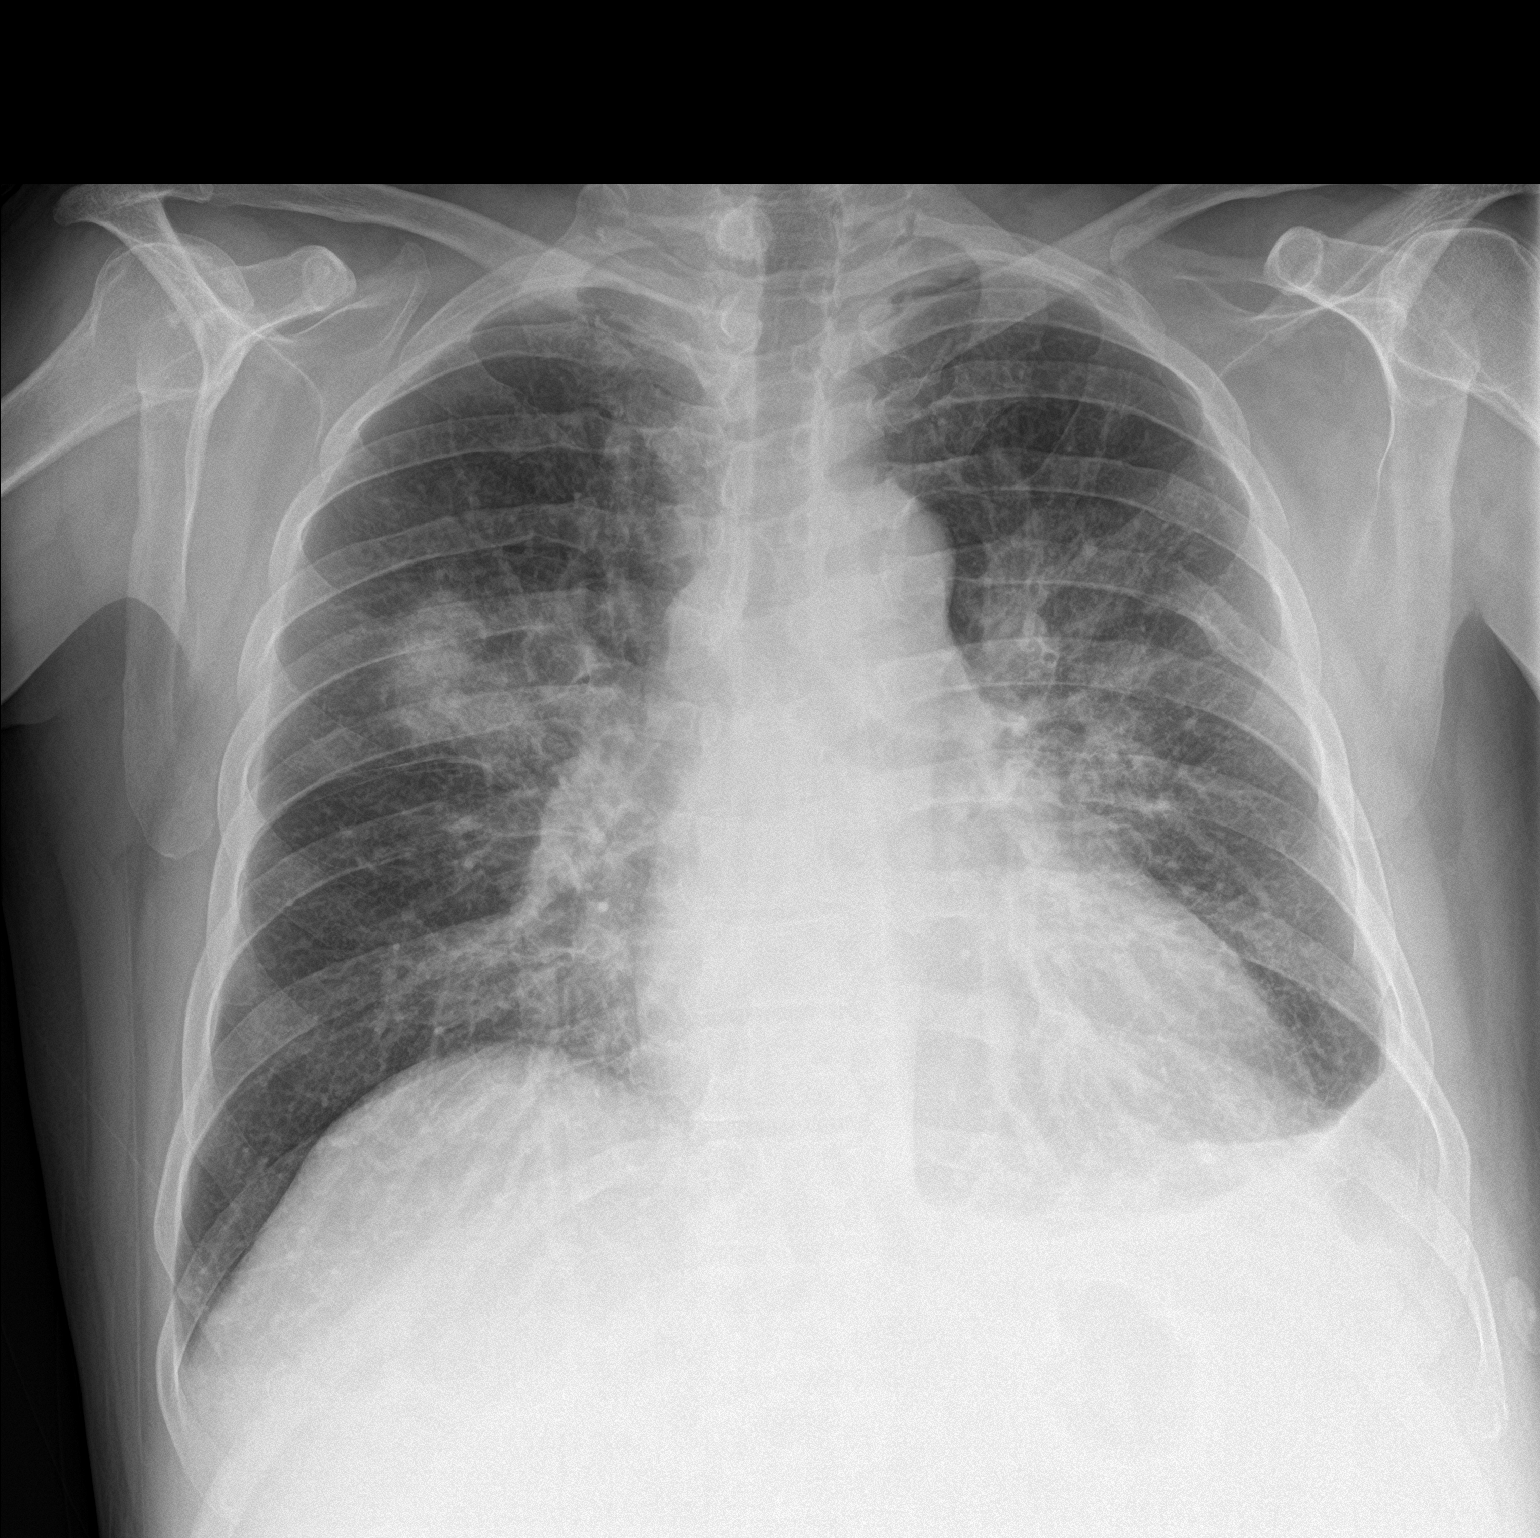

[chest lat]
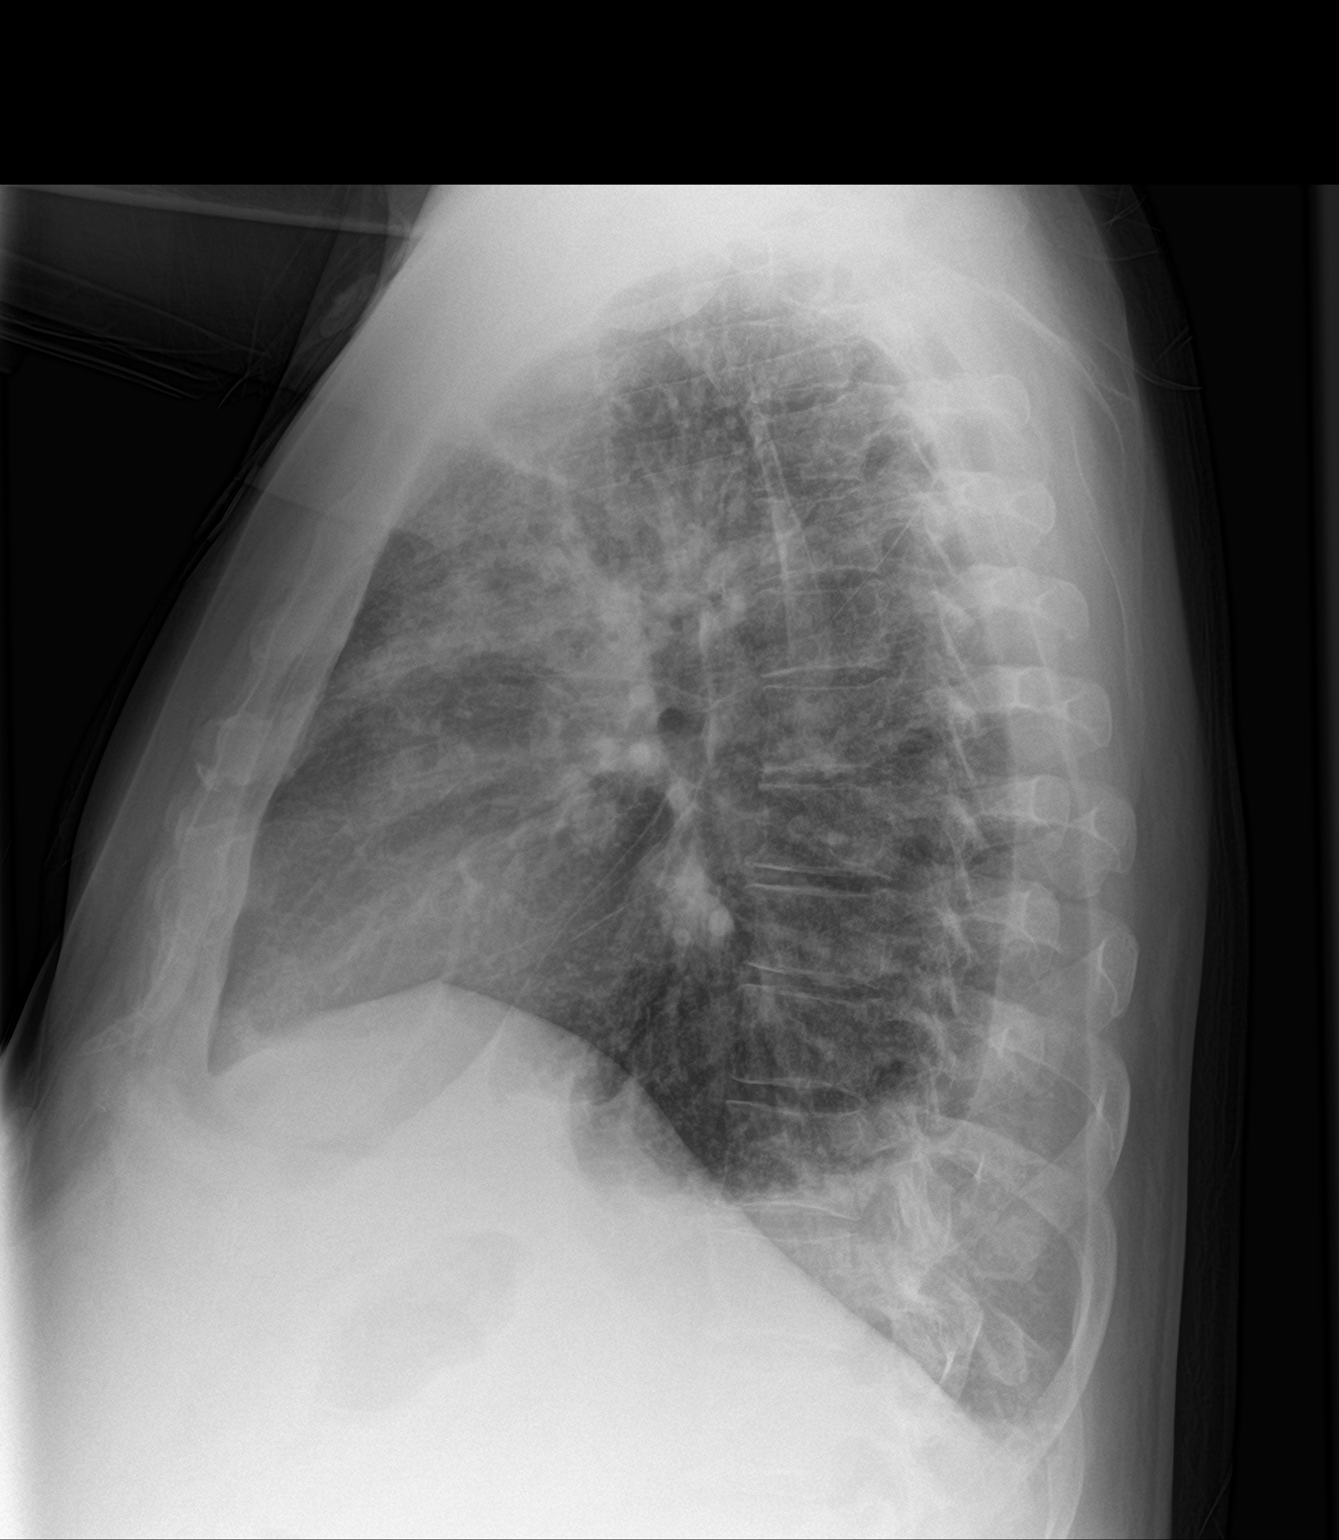

[2 of 2 positions shown; findings below may reference images not displayed]

FINDINGS: Cardiomegaly. There is a rounded, masslike opacity of the central
right upper lobe which measures approximately 4.2 cm in projection.
There is superimposed heterogeneous and interstitial airspace
opacity, particularly of the perihilar left lung. Small left, trace
right pleural effusions. The visualized skeletal structures are
unremarkable.
IMPRESSION: 1. There is a rounded, masslike opacity of the central right upper
lobe which measures approximately 4.2 cm in projection. This is
concerning for malignancy, although may reflect infectious airspace
disease. Consider CT to further evaluate. Absolute minimum recommend
radiographic follow-up at 6-8 weeks to ensure complete resolution.
2. There is superimposed heterogeneous and interstitial airspace
opacity, particularly of the perihilar left lung. Small left, trace
right pleural effusions. Findings suggest edema or infection.
3. Cardiomegaly.

## 2022-10-08 IMAGING — US US ABDOMEN LIMITED RUQ/ASCITES
1 series · 12 of 12 positions shown · non-contrast
Comparison: None.

CLINICAL DATA: Ascites check

EXAM:
LIMITED ABDOMEN ULTRASOUND FOR ASCITES
TECHNIQUE: Limited ultrasound survey for ascites was performed in all four
abdominal quadrants.

[Series 1: us ascites (abdomen limited) · 12 of 12 slices shown]
[im 1/12]
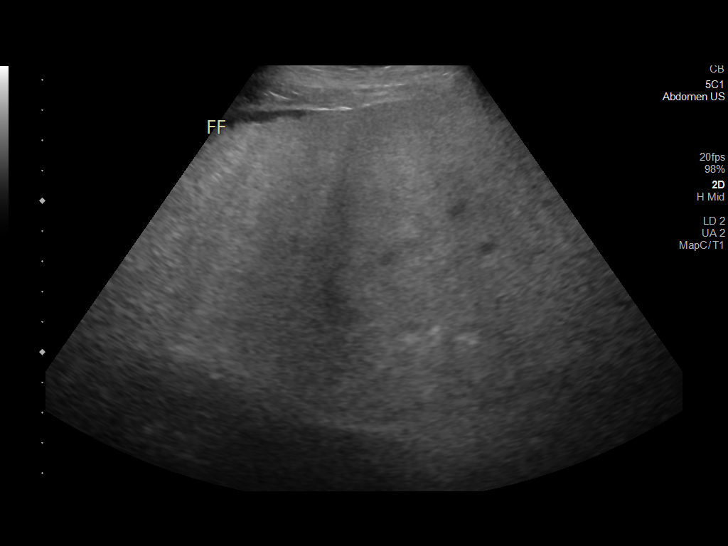
[im 2/12]
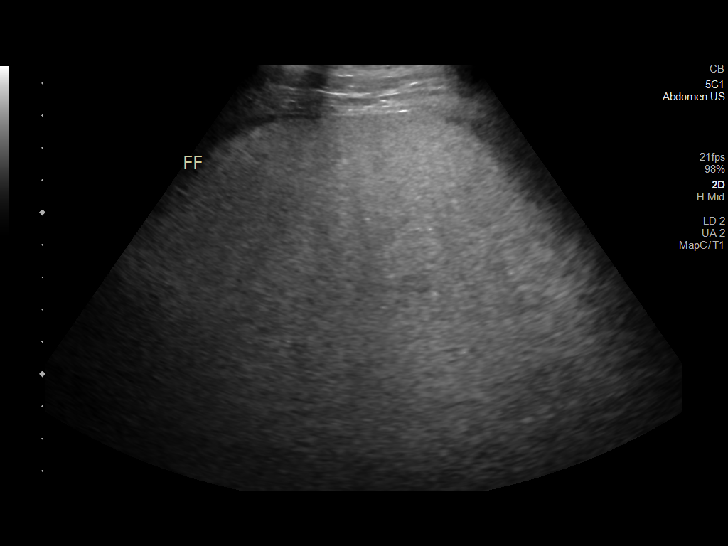
[im 3/12]
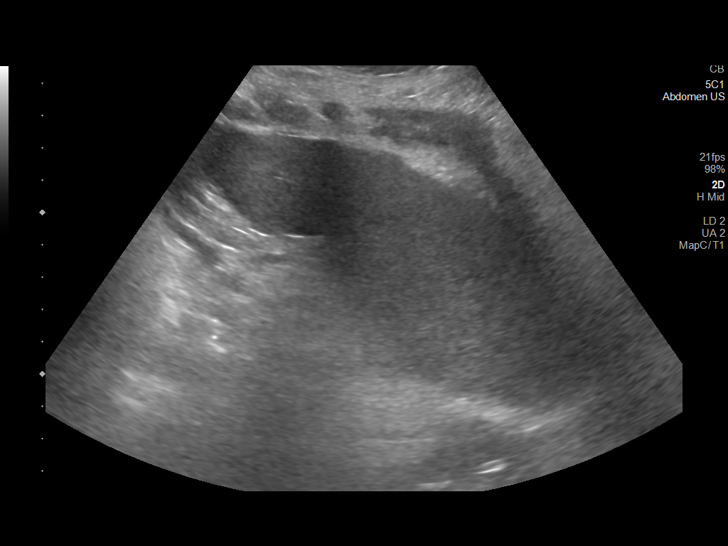
[im 4/12]
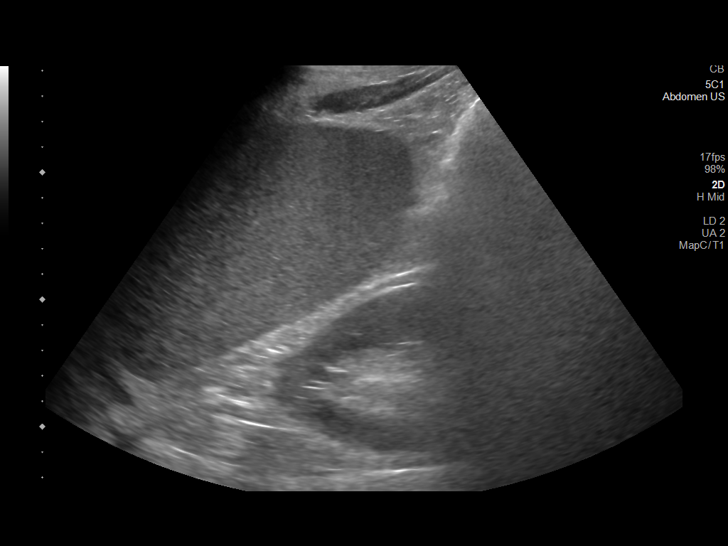
[im 5/12]
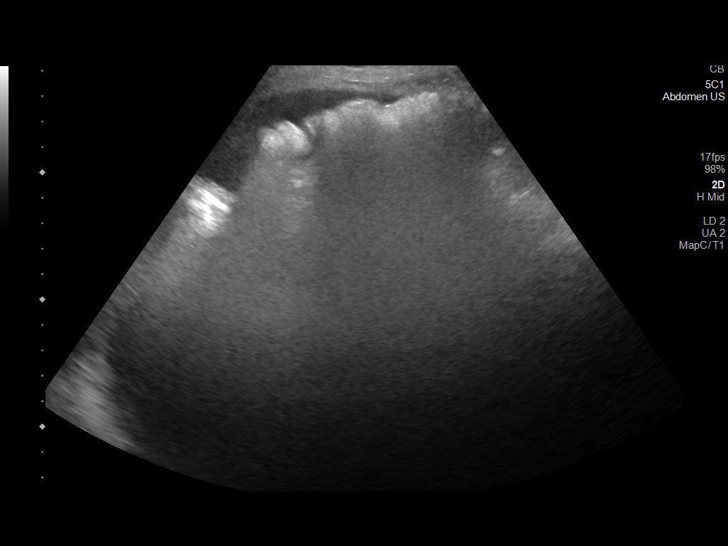
[im 6/12]
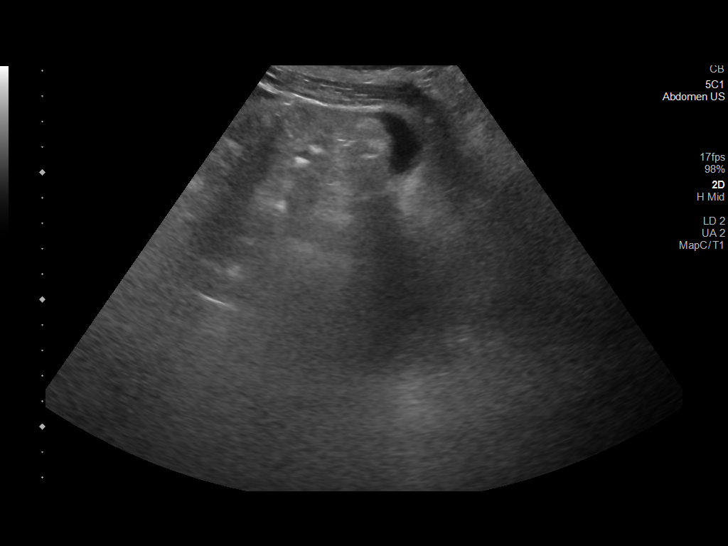
[im 7/12]
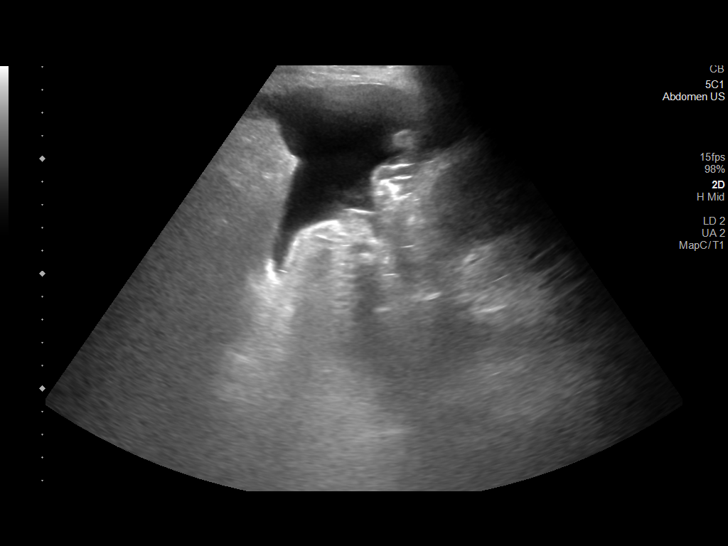
[im 8/12]
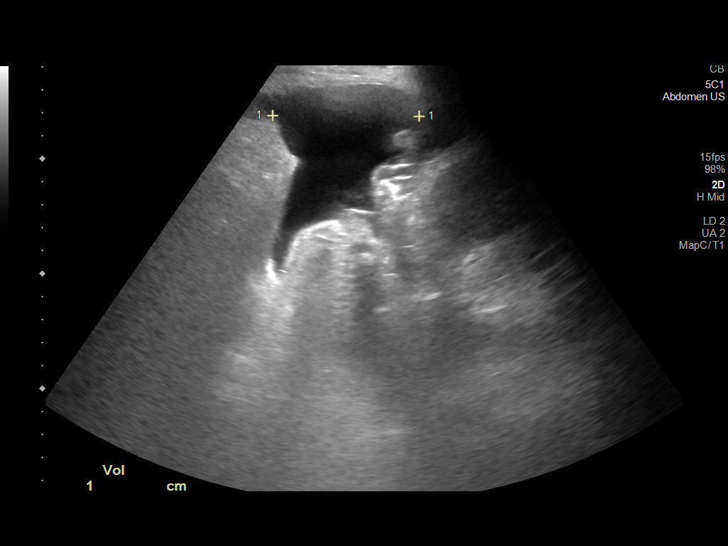
[im 9/12]
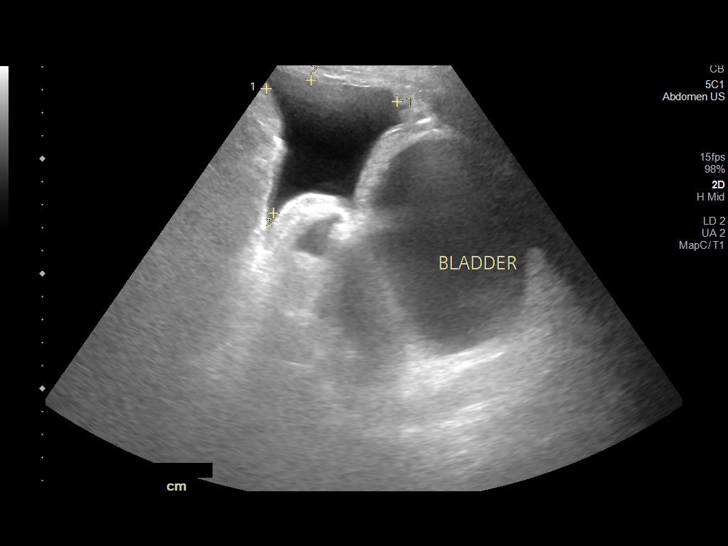
[im 10/12]
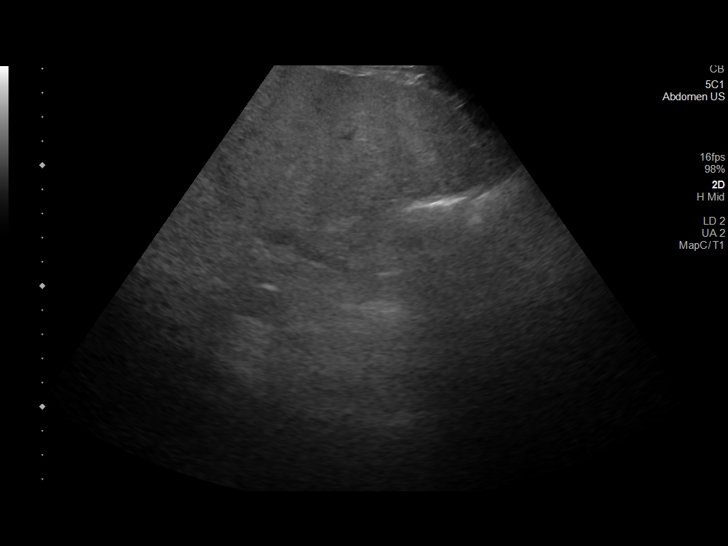
[im 11/12]
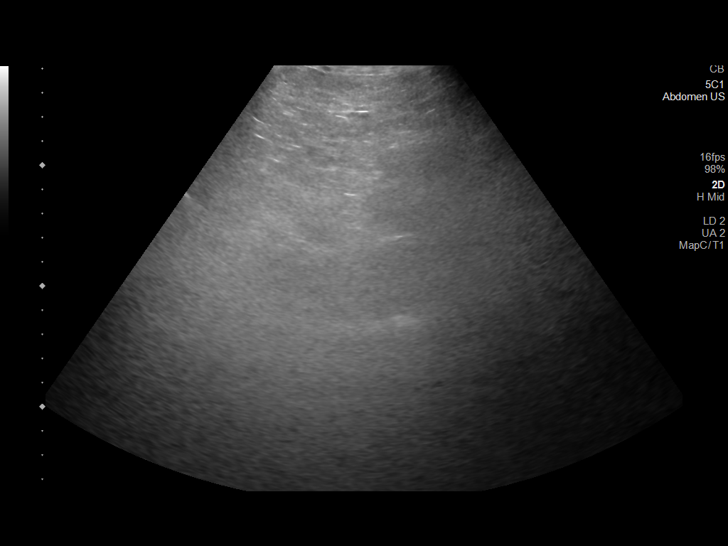
[im 12/12]
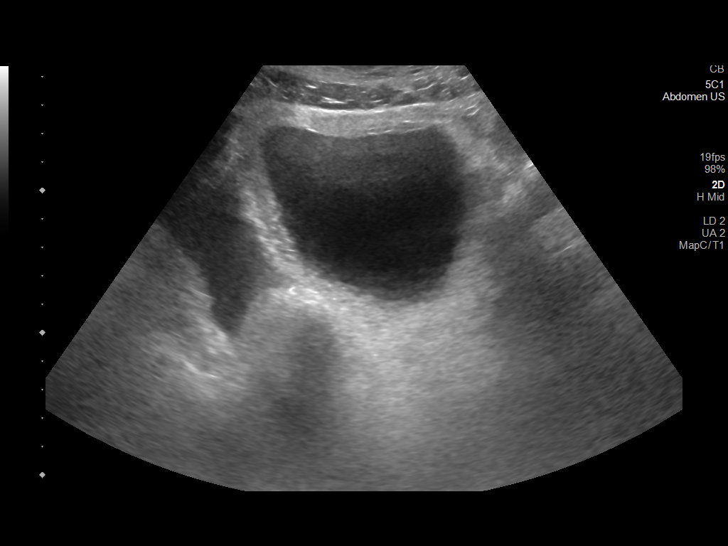

[12 of 12 positions shown; findings below may reference images not displayed]

FINDINGS: Small volume ascites predominantly located within the right upper
quadrant along the liver without pocket measuring approximately
cm in diameter.
IMPRESSION: Small volume ascites predominantly located within the right upper
quadrant.

## 2022-10-09 IMAGING — US US ABDOMEN LIMITED RUQ/ASCITES
1 series · 13 of 25 positions shown · non-contrast
Comparison: None.

CLINICAL DATA: Cirrhosis, ascites

EXAM:
ULTRASOUND ABDOMEN LIMITED RIGHT UPPER QUADRANT

[Series 1: us abdomen limited ruq (liver/gb) · 13 of 54 slices shown]
[im 1/54]
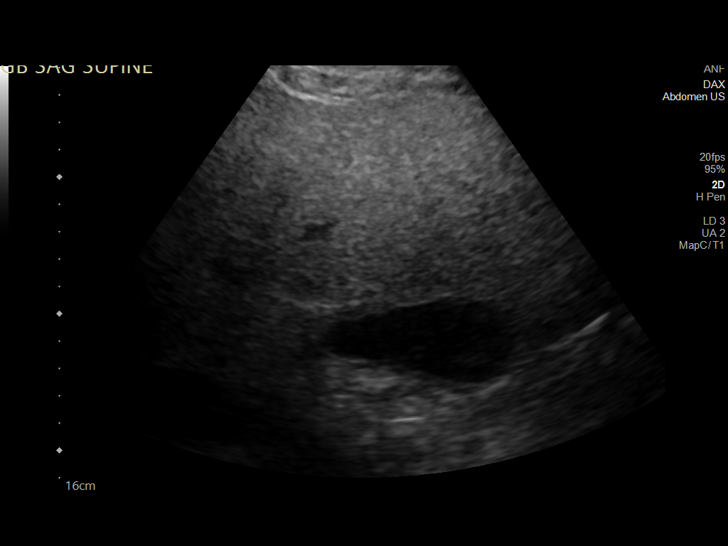
[im 5/54]
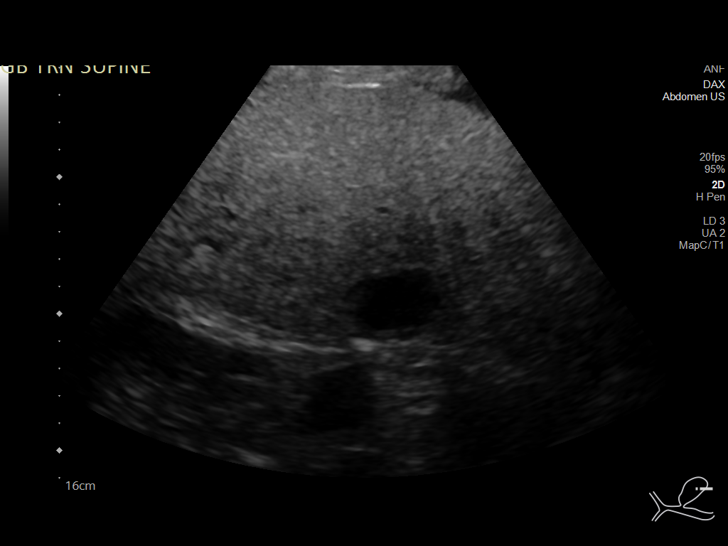
[im 9/54]
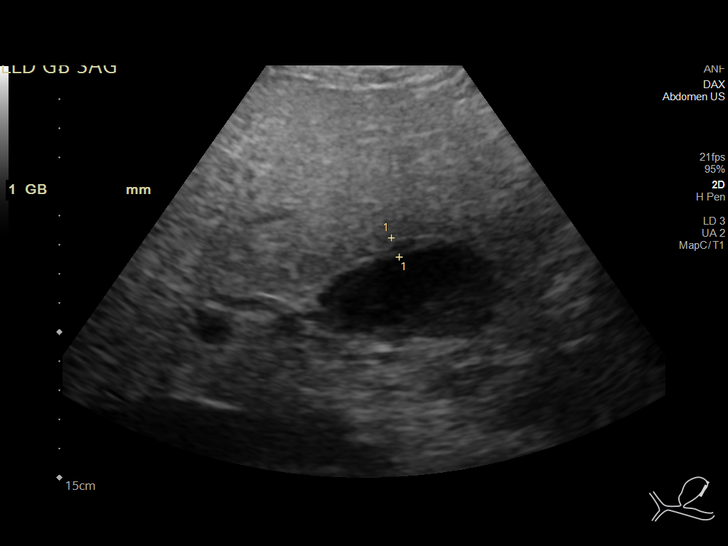
[im 14/54]
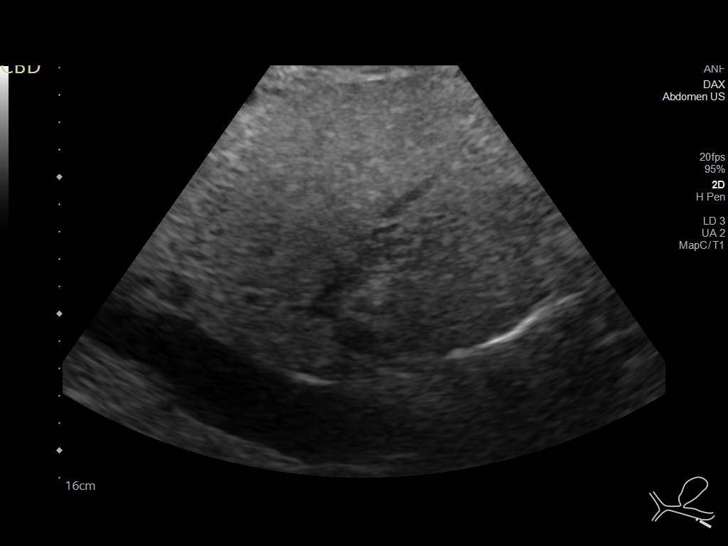
[im 18/54]
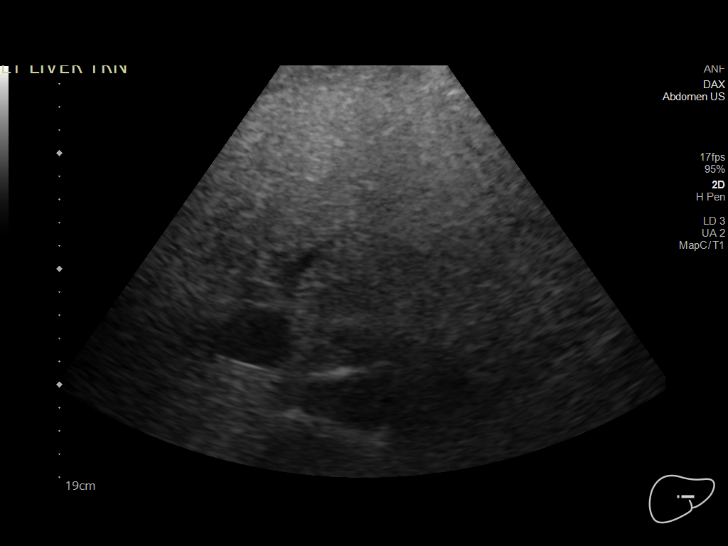
[im 23/54]
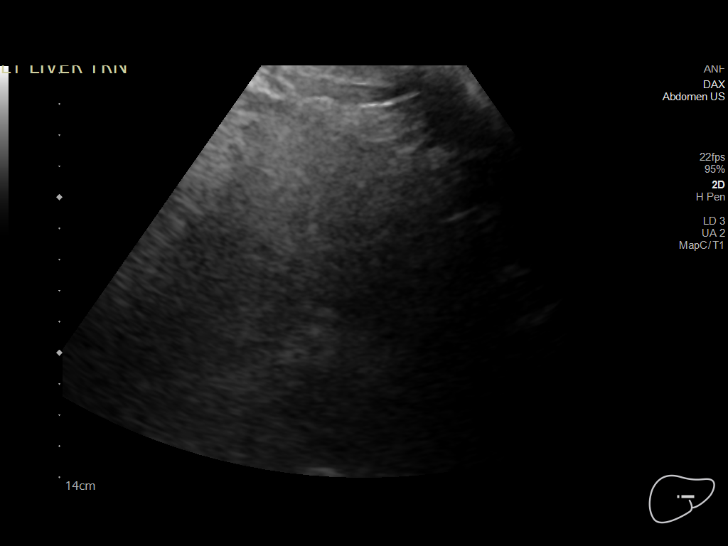
[im 27/54]
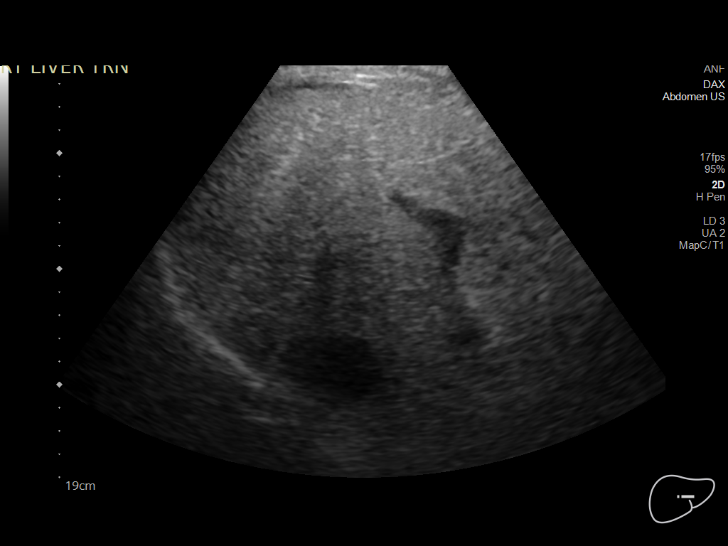
[im 31/54]
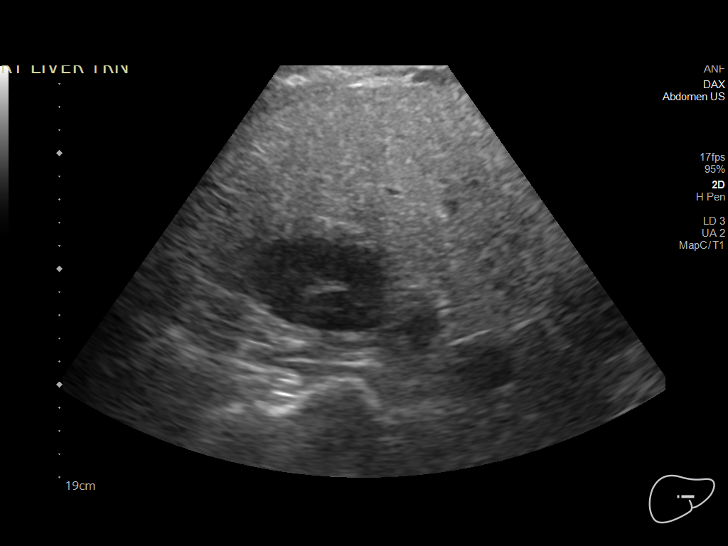
[im 36/54]
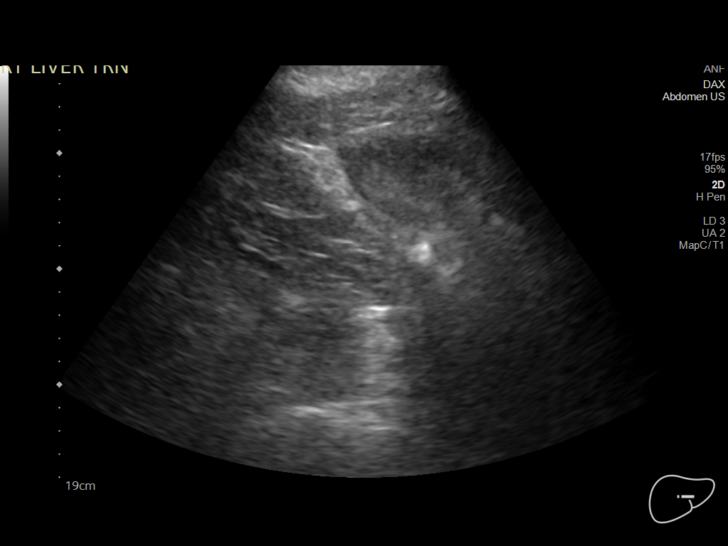
[im 40/54]
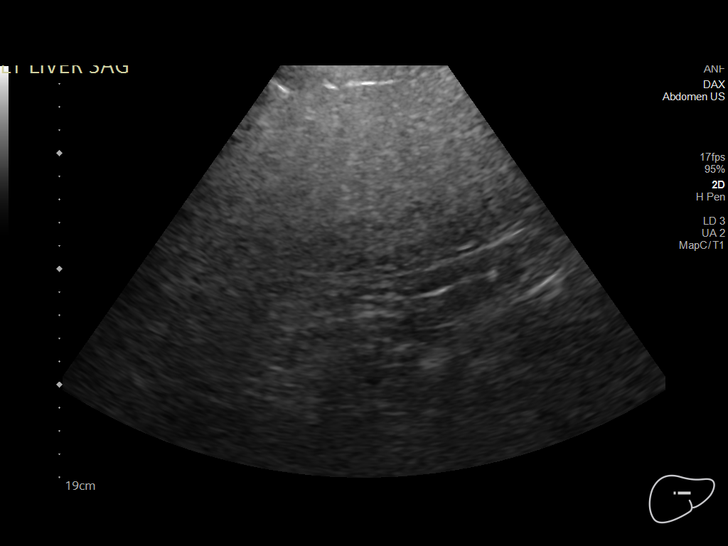
[im 45/54]
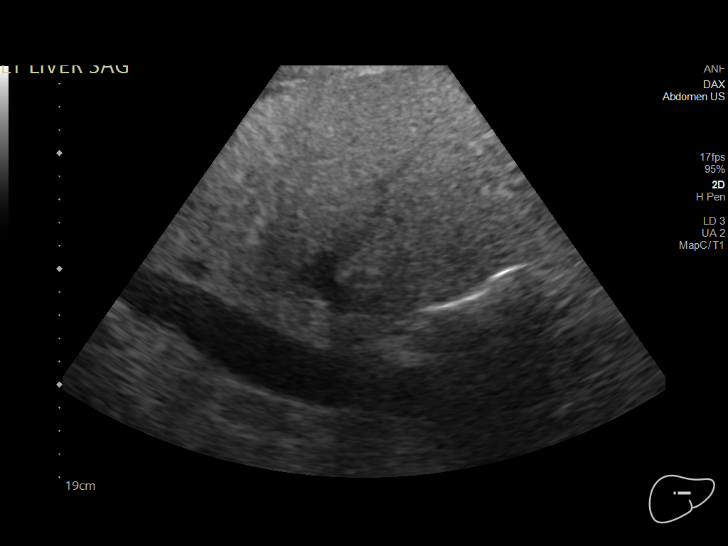
[im 49/54]
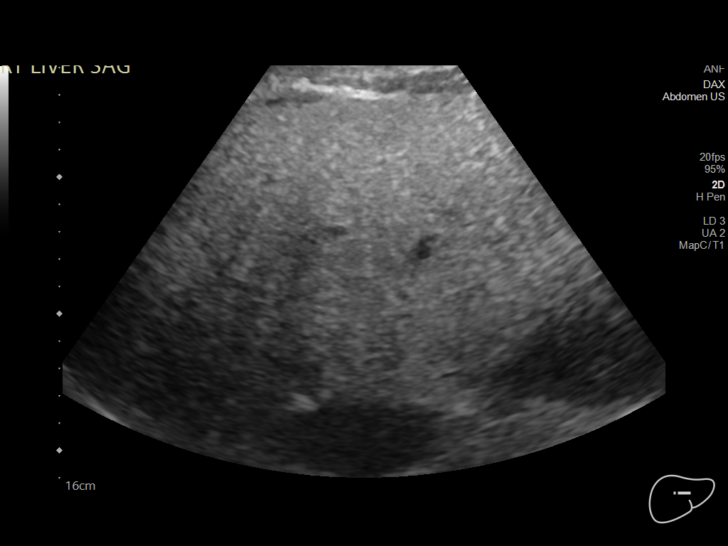
[im 54/54]
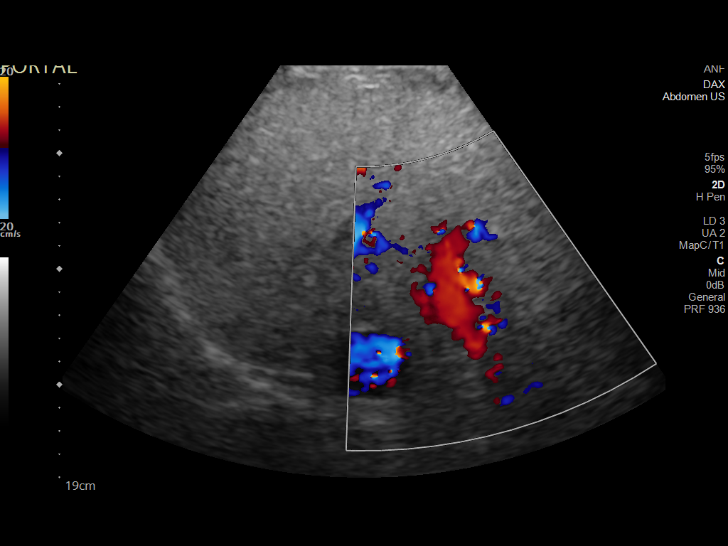

[13 of 25 positions shown; findings below may reference images not displayed]

FINDINGS: Gallbladder:

A small amount of layering sludge is seen within the gallbladder
lumen. The gallbladder, however, is not distended and there is no
gallbladder wall thickening identified. There is trace
pericholecystic fluid, nonspecific in the setting of ascites. The
sonographic Murphy sign is reportedly negative.

Common bile duct:

Diameter: 3 mm in proximal diameter

Liver:

The hepatic parenchyma is diffusely markedly echogenic and there is
poor acoustic through transmission and diffuse coarsening of the
hepatic echotexture. Altogether, the findings are in keeping with
moderate to severe hepatic steatosis. Liver size is mildly enlarged.
No definite focal intrahepatic masses are identified though
parenchymal changes decreased sensitivity for detection of such
masses. There is no intrahepatic biliary ductal dilation identified.
Portal vein is patent on color Doppler imaging with normal direction
of blood flow towards the liver.

Other: Trace perihepatic ascites is noted.
IMPRESSION: Cholelithiasis without sonographic evidence of acute cholecystitis.

Moderate to severe hepatic steatosis.  Mild hepatomegaly.

Mild perihepatic ascites.

## 2022-11-14 DIAGNOSIS — F900 Attention-deficit hyperactivity disorder, predominantly inattentive type: Secondary | ICD-10-CM | POA: Diagnosis not present

## 2023-05-08 DIAGNOSIS — F900 Attention-deficit hyperactivity disorder, predominantly inattentive type: Secondary | ICD-10-CM | POA: Diagnosis not present

## 2023-11-08 DIAGNOSIS — F900 Attention-deficit hyperactivity disorder, predominantly inattentive type: Secondary | ICD-10-CM | POA: Diagnosis not present
